# Patient Record
Sex: Male | Born: 1965 | Race: Black or African American | Hispanic: No | Marital: Married | Smoking: Current some day smoker
Health system: Southern US, Community
[De-identification: ages and names within clinical notes are randomized; demographics above are authoritative.]

## PROBLEM LIST (undated history)

## (undated) DIAGNOSIS — G4700 Insomnia, unspecified: Secondary | ICD-10-CM

## (undated) DIAGNOSIS — N529 Male erectile dysfunction, unspecified: Secondary | ICD-10-CM

## (undated) DIAGNOSIS — E785 Hyperlipidemia, unspecified: Secondary | ICD-10-CM

## (undated) DIAGNOSIS — I10 Essential (primary) hypertension: Secondary | ICD-10-CM

## (undated) DIAGNOSIS — G4730 Sleep apnea, unspecified: Secondary | ICD-10-CM

## (undated) DIAGNOSIS — F4001 Agoraphobia with panic disorder: Secondary | ICD-10-CM

## (undated) DIAGNOSIS — F4310 Post-traumatic stress disorder, unspecified: Secondary | ICD-10-CM

## (undated) DIAGNOSIS — D509 Iron deficiency anemia, unspecified: Secondary | ICD-10-CM

## (undated) DIAGNOSIS — J309 Allergic rhinitis, unspecified: Secondary | ICD-10-CM

## (undated) DIAGNOSIS — M1990 Unspecified osteoarthritis, unspecified site: Secondary | ICD-10-CM

## (undated) DIAGNOSIS — K219 Gastro-esophageal reflux disease without esophagitis: Secondary | ICD-10-CM

## (undated) HISTORY — DX: Hyperlipidemia, unspecified: E78.5

## (undated) HISTORY — PX: UPPER GASTROINTESTINAL ENDOSCOPY: SHX188

## (undated) HISTORY — DX: Insomnia, unspecified: G47.00

## (undated) HISTORY — DX: Post-traumatic stress disorder, unspecified: F43.10

## (undated) HISTORY — DX: Male erectile dysfunction, unspecified: N52.9

## (undated) HISTORY — DX: Allergic rhinitis, unspecified: J30.9

## (undated) HISTORY — PX: FOOT SURGERY: SHX648

## (undated) HISTORY — DX: Gastro-esophageal reflux disease without esophagitis: K21.9

## (undated) HISTORY — DX: Agoraphobia with panic disorder: F40.01

## (undated) HISTORY — DX: Iron deficiency anemia, unspecified: D50.9

## (undated) HISTORY — PX: COLONOSCOPY: SHX174

## (undated) HISTORY — DX: Essential (primary) hypertension: I10

## (undated) HISTORY — PX: ANTERIOR CRUCIATE LIGAMENT REPAIR: SHX115

## (undated) HISTORY — DX: Sleep apnea, unspecified: G47.30

---

## 1999-12-03 ENCOUNTER — Ambulatory Visit (HOSPITAL_BASED_OUTPATIENT_CLINIC_OR_DEPARTMENT_OTHER): Admission: RE | Admit: 1999-12-03 | Discharge: 1999-12-03 | Payer: Self-pay | Admitting: Orthopedic Surgery

## 2002-03-21 HISTORY — PX: MEDIAL COLLATERAL LIGAMENT AND LATERAL COLLATERAL LIGAMENT REPAIR, KNEE: SHX2017

## 2004-10-15 ENCOUNTER — Encounter: Admission: RE | Admit: 2004-10-15 | Discharge: 2004-10-15 | Payer: Self-pay | Admitting: Occupational Medicine

## 2006-11-03 ENCOUNTER — Encounter: Admission: RE | Admit: 2006-11-03 | Discharge: 2006-11-03 | Payer: Self-pay | Admitting: Occupational Medicine

## 2008-09-16 ENCOUNTER — Emergency Department (HOSPITAL_COMMUNITY): Admission: EM | Admit: 2008-09-16 | Discharge: 2008-09-16 | Payer: Self-pay | Admitting: Emergency Medicine

## 2008-11-17 ENCOUNTER — Encounter: Admission: RE | Admit: 2008-11-17 | Discharge: 2008-11-17 | Payer: Self-pay | Admitting: Internal Medicine

## 2010-08-06 NOTE — Op Note (Signed)
[place]  Patient:    Sean Haynes, Sean Haynes                       MRN: [id number] Proc. Date: 12/03/99 Adm. Date:  [id number] Disc. Date: [id number] Attending:  Milly Jakob CC:         Harvie Junior, M.D.   Operative Report  PREOPERATIVE DIAGNOSIS: 1. Anterior cruciate ligament tear. 2. Medial meniscus tear.  POSTOPERATIVE DIAGNOSIS: 1. Anterior cruciate ligament tear. 2. Medial meniscus tear.  OPERATION PERFORMED: 1. Partial posterior horn medial meniscectomy. 2. Anterior cruciate ligament reconstruction with central one third patellar    tendon.  SURGEON:  Harvie Junior, M.D.  ASSISTANT:  Currie Paris. Thedore Mins.  ANESTHESIA:  General.  INDICATIONS FOR PROCEDURE:  He is a 45 year old male with a long history of having had a twisting type injury while playing basketball.  The knee gave out on him and it was feeling a little bit unstable.  He had a big effusion right away and then it went down a little bit.  It was still having pain ____________ instability.  Came in to be evaluated.  It was felt like he had obvious anterior instability.  There was some question in my mind about his meniscus and an MRI was obtained and showed that he had an anterior cruciate ligament tear as well as a posterior horn medial meniscus tear.  After a thorough discussion of operative risks versus benefits, the patient ultimately elected to undergo an anterior cruciate ligament reconstruction.  He was brought to the operating room for this procedure.  DESCRIPTION OF PROCEDURE:  The patient was taken to the operating room and after adequate anesthesia was obtained with general anesthetic, the patient was placed supine on the operating table.  The right leg was then examined under anesthesia.  It had an obvious pivot shift.  It had no significant medial lateral instability.  At that point the leg was prepped and draped in the usual sterile fashion and  following this, routine arthroscopic examination of the knee revealed there was obvious posterior horn medial meniscus tear. The undersurface was probed and then this was felt to be in the white-white zone and also had a complex portion.  The posterior horn of the medial meniscus was then debrided with a combination of straight biting forceps, upbiting forceps and the remaining meniscal rim was contoured down with a suction shaver.  Multiple episodes of pivot shift were encountered while doing work on the posterior horn of the medial meniscus.  At this point attention was turned to the notch after the remaining meniscal rim was contoured down with a suction shaver.  Attention was turned to the notch and there were some decent fibers of the anterior cruciate ligament ____________ but they were not functioning with a probe.  You could just actually pull them apart and there was no insertion to the fibers.  At that point, the attention was turned laterally where there was noted to be a normal lateral compartment.  The anterior cruciate was then debrided the remaining fibers and a notchplasty was performed and following this the knee was exsanguinated and a blood pressure tourniquet was inflated to 300 mmHg.  Following this, an incision was made from the inferior pole of the patella distally.  The patellar tendon was identified both medially and laterally.  It was 41 mm in width.  The central 10 mm was resected with 10 mm bone plugs  proximally and distally.  Following this, attention was turned back to the knee where a 60 mm guide was used on a tibial guide and a tibial pilot hole was drilled.  The over-the-top guide was then used and the 10 mm hole was drilled on the femoral side.  A 5 mm footprint was made first to ensure there was adequate back wall and there was. At that point, the notch was made for the femoral screw.  The knee was then copiously irrigated and then suctioned dry.  A probe  was used to remove all bony fragments both medially and laterally.  Attention was then turned towards the femoral side and the Beeth needle which had previously been passed was pulled through the knee and the graft was advanced through the knee.  The femoral side was then locked in with a 7 x 25 mm screw, 5 mm recessed.  The knee was then cycled 10 times with tension on the graft. There was no tendency towards motion of the graft.  It was isometrically placed. At this point the leg was held in 10 degrees of flexion with a slight posterior drawer and the tibial screw was then advanced into place.  At this point the knee was then examined.  Noted the pivot shift was gone.  The anterior drawer was gone.  The graft was probed and felt to be excellently tightened and the screw was then advanced until the graft was locked in place.  At this point, a final check was made of the graft went through full range of motion easily in to full extension and the sutures were removed and the wire was removed distally.  The patellar tendon defect was then closed with four interrupted #2 Ethibond sutures and then the paratenon was closed with a 0 Vicryl running suture.  The subcutaneous was then closed with 0 and 2-0 Vicryl and the skin with 3-0 Monocryl suture.  Steri-Strips were placed over the portals and a sterile compressive dressing was then applied as well as a TED stocking.  Then the cryocuff and knee immobilizer were applied and the patient was taken to the recovery room where he was noted to be in satisfactory condition.  Estimated blood loss was about none. DD:  12/03/99 TD:  12/05/99 Job: [id number]

## 2010-12-28 ENCOUNTER — Encounter: Payer: Self-pay | Admitting: Cardiology

## 2010-12-28 ENCOUNTER — Ambulatory Visit (INDEPENDENT_AMBULATORY_CARE_PROVIDER_SITE_OTHER): Payer: BC Managed Care – PPO | Admitting: Cardiology

## 2010-12-28 DIAGNOSIS — R9431 Abnormal electrocardiogram [ECG] [EKG]: Secondary | ICD-10-CM

## 2010-12-28 DIAGNOSIS — I10 Essential (primary) hypertension: Secondary | ICD-10-CM | POA: Insufficient documentation

## 2010-12-28 DIAGNOSIS — F17200 Nicotine dependence, unspecified, uncomplicated: Secondary | ICD-10-CM | POA: Insufficient documentation

## 2010-12-28 DIAGNOSIS — Z720 Tobacco use: Secondary | ICD-10-CM

## 2010-12-28 MED ORDER — CLONIDINE HCL 0.1 MG PO TABS
0.1000 mg | ORAL_TABLET | Freq: Once | ORAL | Status: AC
  Administered 2010-12-28: 0.1 mg via ORAL

## 2010-12-28 MED ORDER — LABETALOL HCL 100 MG PO TABS: 100.0000 mg | ORAL_TABLET | Freq: Two times a day (BID) | ORAL | Status: DC

## 2010-12-28 NOTE — Patient Instructions (Addendum)
Your physician recommends that you schedule a follow-up appointment in: 6 weeks  Your physician has requested that you have an echocardiogram. Echocardiography is a painless test that uses sound waves to create images of your heart. It provides your doctor with information about the size and shape of your heart and how well your heart's chambers and valves are working. This procedure takes approximately one hour. There are no restrictions for this procedure.   Your physician has recommended you make the following change in your medication:Start labetalol 100 mg by mouth twice daily.

## 2010-12-28 NOTE — Progress Notes (Signed)
HPI: 45 year old male with no prior cardiac history for evaluation of abnormal electrocardiogram and hypertension. Patient recently noted to have elevated blood pressure on screening. An electrocardiogram was performed and showed sinus rhythm with left ventricular hypertrophy and peaked T waves. Because of the above we were asked to further evaluate. Note his potassium was checked and was normal at 4.4. TSH normal. LFTs were mildly elevated and fu labs scheduled. The patient denies dyspnea, chest pain, palpitations or syncope. No headaches.  Current Outpatient Prescriptions  Medication Sig Dispense Refill  . lisinopril-hydrochlorothiazide (PRINZIDE,ZESTORETIC) 20-12.5 MG per tablet Take 1 tablet by mouth daily.          No Known Allergies  Past Medical History  Diagnosis Date  . Hypertension   . Hyperlipidemia     Past Surgical History  Procedure Date  . Medial collateral ligament and lateral collateral ligament repair, knee 2004    right    History   Social History  . Marital Status: Single    Spouse Name: N/A    Number of Children: 2  . Years of Education: N/A   Occupational History  .  [employer]   Social History Main Topics  . Smoking status: Current Everyday Smoker -- 2.0 packs/day for 34 years    Types: Cigarettes  . Smokeless tobacco: Never Used  . Alcohol Use: Yes     6 pack of beer and half a fifth per week  . Drug Use: No  . Sexually Active: Not on file   Other Topics Concern  . Not on file   Social History Narrative  . No narrative on file    Family History  Problem Relation Age of Onset  . Anemia Mother   . Arrhythmia Mother   . Arrhythmia Sister   . Arrhythmia Brother   . Diabetes Mother   . Diabetes Father   . Hyperlipidemia Mother   . Hyperlipidemia Father   . Hypertension Mother   . Hypertension Father   . Hypertension Sister   . Hypertension Sister   . Hypertension Brother   . Hypertension Brother   . Stroke Father     ROS:  no fevers or chills, productive cough, hemoptysis, dysphasia, odynophagia, melena, hematochezia, dysuria, hematuria, rash, seizure activity, orthopnea, PND, pedal edema, claudication. Remaining systems are negative.  Physical Exam: Blood pressure left arm 198/110. Blood pressure right arm 204/112. General:  Well developed/well nourished in NAD Skin warm/dry Patient not depressed No peripheral clubbing Back-normal HEENT-normal/normal eyelids; no papilledema.  Neck supple/normal carotid upstroke bilaterally; no bruits; no JVD; no thyromegaly chest - CTA/ normal expansion CV - RRR/normal S1 and S2; no murmurs, rubs or gallops;  PMI nondisplaced Abdomen -NT/ND, no HSM, no mass, + bowel sounds, no bruit 2+ femoral pulses, no bruits Ext-no edema, chords, 2+ DP Neuro-grossly nonfocal  ECG 12/27/10 NSR LVH peaked Tw[place]

## 2010-12-28 NOTE — Assessment & Plan Note (Signed)
Patient's electrocardiogram shows left ventricular hypertrophy and peak T waves. However potassium is normal. No symptoms. Await echocardiogram.

## 2010-12-28 NOTE — Assessment & Plan Note (Addendum)
Patient's blood pressure significantly elevated today. There is no papilledema and no symptoms of headache, shortness of breath or chest pain. Patient given 0.1 mg of clonidine in the office p.o. Blood pressure improved to 178/106. Continue lisinopril HCT. Add labetalol 100 mg b.i.d and increase as needed. I have instructed patient to purchase a blood pressure monitor and keep records at home. We will increase medications as needed. Note recent TSH normal. Check echocardiogram for LV function and left ventricular hypertrophy. Patient instructed on lifestyle modification including exercise, low sodium diet, discontinuing tobacco use and reducing alcohol use. I think he most likely has primary hypertension as he was diagnosed many years ago and has a strong family history. If his blood pressure becomes difficult to control we will consider workup for secondary hypertension.

## 2010-12-28 NOTE — Assessment & Plan Note (Signed)
Patient counseled on discontinuing. 

## 2011-01-06 ENCOUNTER — Encounter: Payer: Self-pay | Admitting: *Deleted

## 2011-01-11 ENCOUNTER — Other Ambulatory Visit (HOSPITAL_COMMUNITY): Payer: BC Managed Care – PPO | Admitting: Radiology

## 2011-01-11 ENCOUNTER — Ambulatory Visit (HOSPITAL_COMMUNITY): Payer: BC Managed Care – PPO | Attending: Cardiology | Admitting: Radiology

## 2011-01-11 DIAGNOSIS — F17200 Nicotine dependence, unspecified, uncomplicated: Secondary | ICD-10-CM | POA: Insufficient documentation

## 2011-01-11 DIAGNOSIS — I10 Essential (primary) hypertension: Secondary | ICD-10-CM

## 2011-01-11 DIAGNOSIS — R9431 Abnormal electrocardiogram [ECG] [EKG]: Secondary | ICD-10-CM

## 2011-01-19 ENCOUNTER — Telehealth: Payer: Self-pay | Admitting: Cardiology

## 2011-01-19 DIAGNOSIS — I10 Essential (primary) hypertension: Secondary | ICD-10-CM

## 2011-01-19 MED ORDER — LABETALOL HCL 100 MG PO TABS: 100.0000 mg | ORAL_TABLET | Freq: Two times a day (BID) | ORAL | Status: DC

## 2011-01-19 MED ORDER — LISINOPRIL-HYDROCHLOROTHIAZIDE 20-12.5 MG PO TABS: 1.0000 | ORAL_TABLET | Freq: Every day | ORAL | Status: DC

## 2011-01-19 NOTE — Telephone Encounter (Signed)
Spoke with pt, his bp is running good on his current regimen. He is needing refills, sent to pharm [facility]

## 2011-01-19 NOTE — Telephone Encounter (Signed)
New message  Pt calling about meds lisinipril and labetalol  He wants to know if he should continue taking both meds?  Please call

## 2011-02-02 ENCOUNTER — Encounter: Payer: Self-pay | Admitting: Cardiology

## 2011-02-18 ENCOUNTER — Encounter: Payer: Self-pay | Admitting: Cardiology

## 2011-02-18 ENCOUNTER — Ambulatory Visit (INDEPENDENT_AMBULATORY_CARE_PROVIDER_SITE_OTHER): Payer: BC Managed Care – PPO | Admitting: Cardiology

## 2011-02-18 VITALS — BP 120/70 | HR 87 | Ht 76.0 in | Wt 251.0 lb

## 2011-02-18 DIAGNOSIS — I10 Essential (primary) hypertension: Secondary | ICD-10-CM

## 2011-02-18 MED ORDER — LISINOPRIL 40 MG PO TABS: 40.0000 mg | ORAL_TABLET | Freq: Every day | ORAL | Status: DC

## 2011-02-18 MED ORDER — HYDROCHLOROTHIAZIDE 12.5 MG PO CAPS: 12.5000 mg | ORAL_CAPSULE | Freq: Every day | ORAL | Status: DC

## 2011-02-18 NOTE — Assessment & Plan Note (Signed)
Patient counseled on discontinuing. 

## 2011-02-18 NOTE — Assessment & Plan Note (Signed)
Patient's blood pressure has improved but he states it typically runs 140/85-90 at home on present medications. Increase lisinopril to 40 mg daily. Check potassium and renal function in one week. He will continue lifestyle modification. Followup in 6 months and he will track his blood pressure at home. If it is controlled we will ask him to followup with his primary care physician at that time.

## 2011-02-18 NOTE — Progress Notes (Signed)
HPI:45 year old male I initially saw in Oct 2012 for evaluation of abnormal electrocardiogram and hypertension. Note his potassium was checked and was normal at 4.4. TSH normal. LFTs were mildly elevated and fu labs scheduled. Echo in Oct 2012 showed normal LV function, mild LVH and moderate LAE. Since I last saw him, the patient denies any dyspnea on exertion, orthopnea, PND, pedal edema, palpitations, syncope or chest pain.    Current Outpatient Prescriptions  Medication Sig Dispense Refill  . labetalol (NORMODYNE) 100 MG tablet Take 1 tablet (100 mg total) by mouth 2 (two) times daily.  60 tablet  12  . lisinopril-hydrochlorothiazide (PRINZIDE,ZESTORETIC) 20-12.5 MG per tablet Take 1 tablet by mouth daily.  30 tablet  12     Past Medical History  Diagnosis Date  . Hypertension   . Hyperlipidemia     Past Surgical History  Procedure Date  . Medial collateral ligament and lateral collateral ligament repair, knee 2004    right    History   Social History  . Marital Status: Single    Spouse Name: N/A    Number of Children: 2  . Years of Education: N/A   Occupational History  .  [employer]   Social History Main Topics  . Smoking status: Current Everyday Smoker -- 2.0 packs/day for 34 years    Types: Cigarettes  . Smokeless tobacco: Never Used  . Alcohol Use: Yes     6 pack of beer and half a fifth per week  . Drug Use: No  . Sexually Active: Not on file   Other Topics Concern  . Not on file   Social History Narrative  . No narrative on file    ROS: no fevers or chills, productive cough, hemoptysis, dysphasia, odynophagia, melena, hematochezia, dysuria, hematuria, rash, seizure activity, orthopnea, PND, pedal edema, claudication. Remaining systems are negative.  Physical Exam: Well-developed well-nourished in no acute distress.  Skin is warm and dry.  HEENT is normal.  Neck is supple. No thyromegaly.  Chest is clear to auscultation with normal  expansion.  Cardiovascular exam is regular rate and rhythm.  Abdominal exam nontender or distended. No masses palpated. Extremities show no edema. neuro grossly intact

## 2011-02-18 NOTE — Patient Instructions (Signed)
Your physician wants you to follow-up in: 6 MONTHS You will receive a reminder letter in the mail two months in advance. If you don't receive a letter, please call our office to schedule the follow-up appointment.   STOP LISINOPRIL/HCTZ  START LISINOPRIL 40 MG ONCE DAILY  START HCTZ 12.5 MG ONCE DAILY  Your physician recommends that you return for lab work in: ONE WEEK

## 2011-02-25 ENCOUNTER — Other Ambulatory Visit (INDEPENDENT_AMBULATORY_CARE_PROVIDER_SITE_OTHER): Payer: BC Managed Care – PPO | Admitting: *Deleted

## 2011-02-25 DIAGNOSIS — I10 Essential (primary) hypertension: Secondary | ICD-10-CM

## 2011-02-25 LAB — BASIC METABOLIC PANEL
BUN: 17 mg/dL (ref 6–23)
CO2: 26 mEq/L (ref 19–32)
Calcium: 9 mg/dL (ref 8.4–10.5)
Chloride: 106 mEq/L (ref 96–112)
Creatinine, Ser: 1.4 mg/dL (ref 0.4–1.5)
GFR: 72.75 mL/min (ref >60.00)
Glucose, Bld: 83 mg/dL (ref 70–99)
Sodium: 141 mEq/L (ref 135–145)

## 2011-10-27 ENCOUNTER — Encounter: Payer: BC Managed Care – PPO | Admitting: Cardiology

## 2011-10-27 NOTE — Progress Notes (Signed)
   HPI: 46 year old male I initially saw in Oct 2012 for evaluation of abnormal electrocardiogram and hypertension. Note his potassium was checked and was normal at 4.4. TSH normal. LFTs were mildly elevated and fu labs scheduled. Echo in Oct 2012 showed normal LV function, mild LVH and moderate LAE. Since I last saw him, the patient denies any dyspnea on exertion, orthopnea, PND, pedal edema, palpitations, syncope or chest pain.   Current Outpatient Prescriptions  Medication Sig Dispense Refill  . hydrochlorothiazide (MICROZIDE) 12.5 MG capsule Take 1 capsule (12.5 mg total) by mouth daily.  30 capsule  12  . labetalol (NORMODYNE) 100 MG tablet Take 1 tablet (100 mg total) by mouth 2 (two) times daily.  60 tablet  12  . lisinopril (PRINIVIL,ZESTRIL) 40 MG tablet Take 1 tablet (40 mg total) by mouth daily.  30 tablet  12     Past Medical History  Diagnosis Date  . Hypertension   . Hyperlipidemia     Past Surgical History  Procedure Date  . Medial collateral ligament and lateral collateral ligament repair, knee 2004    right    History   Social History  . Marital Status: Single    Spouse Name: N/A    Number of Children: 2  . Years of Education: N/A   Occupational History  .  [employer]   Social History Main Topics  . Smoking status: Current Everyday Smoker -- 2.0 packs/day for 34 years    Types: Cigarettes  . Smokeless tobacco: Never Used  . Alcohol Use: Yes     6 pack of beer and half a fifth per week  . Drug Use: No  . Sexually Active: Not on file   Other Topics Concern  . Not on file   Social History Narrative  . No narrative on file    ROS: no fevers or chills, productive cough, hemoptysis, dysphasia, odynophagia, melena, hematochezia, dysuria, hematuria, rash, seizure activity, orthopnea, PND, pedal edema, claudication. Remaining systems are negative.  Physical Exam: Well-developed well-nourished in no acute distress.  Skin is warm and dry.  HEENT  is normal.  Neck is supple. No thyromegaly.  Chest is clear to auscultation with normal expansion.  Cardiovascular exam is regular rate and rhythm.  Abdominal exam nontender or distended. No masses palpated. Extremities show no edema. neuro grossly intact  ECG     This encounter was created in error - please disregard.

## 2012-10-23 ENCOUNTER — Other Ambulatory Visit: Payer: Self-pay | Admitting: Family Medicine

## 2012-10-23 ENCOUNTER — Ambulatory Visit
Admission: RE | Admit: 2012-10-23 | Discharge: 2012-10-23 | Disposition: A | Discharge status: Home / Self Care | Payer: No Typology Code available for payment source | Source: Ambulatory Visit | Attending: Family Medicine | Admitting: Family Medicine

## 2012-10-23 DIAGNOSIS — Z0000 Encounter for general adult medical examination without abnormal findings: Secondary | ICD-10-CM

## 2012-11-15 ENCOUNTER — Ambulatory Visit (INDEPENDENT_AMBULATORY_CARE_PROVIDER_SITE_OTHER): Payer: BC Managed Care – PPO | Admitting: Physician Assistant

## 2012-11-15 VITALS — BP 150/90 | HR 102 | Temp 99.0°F | Resp 16 | Ht 75.25 in | Wt 271.0 lb

## 2012-11-15 DIAGNOSIS — I10 Essential (primary) hypertension: Secondary | ICD-10-CM

## 2012-11-15 MED ORDER — LABETALOL HCL 100 MG PO TABS: 100.0000 mg | ORAL_TABLET | Freq: Two times a day (BID) | ORAL | Status: DC

## 2012-11-15 MED ORDER — HYDROCHLOROTHIAZIDE 12.5 MG PO CAPS: 25.0000 mg | ORAL_CAPSULE | Freq: Every day | ORAL | Status: DC

## 2012-11-15 MED ORDER — LISINOPRIL 40 MG PO TABS: 40.0000 mg | ORAL_TABLET | Freq: Every day | ORAL | Status: DC

## 2012-11-15 MED ORDER — HYDROCHLOROTHIAZIDE 25 MG PO TABS: 25.0000 mg | ORAL_TABLET | Freq: Every day | ORAL | Status: DC

## 2012-11-15 NOTE — Progress Notes (Signed)
Patient ID: Sean Haynes MRN: [id number], DOB: December 20, 1965, 47 y.o. Date of Encounter: 11/15/2012, 11:02 AM  Primary Physician: No primary provider on file.  Chief Complaint: HTN  HPI: 47 y.o. male with history below presents for hypertension follow up. Has been out of his medications for 2 days. Previously followed by outside urgent care. Trying to get reestablished with the [facility] healthcare system, waiting on paperwork. Has been taking lisinopril 40 mg daily, HCTZ 12.5 mg daily, and labetalol 100 mg bid without issues. Blood pressure at his recent work CPE 1.5 weeks ago was 120's/80's. Has not checked his BP at home recently. Rarely drinks ETOH, maybe a glass once a month. Working on quitting smoking. Starting to eat healthy. No chest pain, chest tightness, headaches, vision changes, palpitations, edema, orthopnea, or focal deficits. He had labs drawn at his work CPE and they were all normal he reports except his cholesterol was a little high. He was advised to work on diet and exercise and follow up with fasting lipids in 6 moths.     Past Medical History  Diagnosis Date  . Hypertension   . Hyperlipidemia      Home Meds: Prior to Admission medications   Medication Sig Start Date End Date Taking? Authorizing Provider  hydrochlorothiazide (HYDRODIURIL) 12.5 MG tablet Take 1 tablet (25 mg total) by mouth daily. 11/15/12  no Raymon Mutton Dunn, PA-C  labetalol (NORMODYNE) 100 MG tablet Take 1 tablet (100 mg total) by mouth 2 (two) times daily. 11/15/12 11/15/13 no Raymon Mutton Dunn, PA-C  lisinopril (PRINIVIL,ZESTRIL) 40 MG tablet Take 1 tablet (40 mg total) by mouth daily. 11/15/12 11/15/13 no Sondra Barges, PA-C    Allergies: No Known Allergies  History   Social History  . Marital Status: Single    Spouse Name: N/A    Number of Children: 2  . Years of Education: N/A   Occupational History  .  [employer]   Social History Main Topics  . Smoking status: Current Every Day Smoker -- 2.00  packs/day for 34 years    Types: Cigarettes  . Smokeless tobacco: Never Used  . Alcohol Use: Yes     Comment: 6 pack of beer and half a fifth per week  . Drug Use: No  . Sexual Activity: Not on file   Other Topics Concern  . Not on file   Social History Narrative  . No narrative on file     Family History  Problem Relation Age of Onset  . Anemia Mother   . Arrhythmia Mother   . Arrhythmia Sister   . Arrhythmia Brother   . Diabetes Mother   . Diabetes Father   . Hyperlipidemia Mother   . Hyperlipidemia Father   . Hypertension Mother   . Hypertension Father   . Hypertension Sister   . Hypertension Sister   . Hypertension Brother   . Hypertension Brother   . Stroke Father     Review of Systems: Constitutional: negative for chills, fever, night sweats, weight changes, or fatigue  HEENT: negative for vision changes or hearing loss Cardiovascular: negative for chest pain, chest tightness, edema, orthopnea, palpitations, or DOE Respiratory: negative for wheezing, shortness of breath, or cough Abdominal: negative for abdominal pain, nausea, vomiting, diarrhea, or constipation Dermatological: negative for rash Neurologic: negative for headache, dizziness, or syncope   Physical Exam: Blood pressure 150/90, pulse 102, temperature 99 F (37.2 C), temperature source Oral, resp. rate 16, height 6' 3.25" (1.911 m), weight  271 lb (122.925 kg), SpO2 99.00%., Body mass index is 33.66 kg/(m^2). General: Well developed, well nourished, in no acute distress. Head: Normocephalic, atraumatic, eyes without discharge, sclera non-icteric, nares are without discharge. Bilateral auditory canals clear, TM's are without perforation, pearly grey and translucent with reflective cone of light bilaterally. Oral cavity moist, posterior pharynx without exudate, erythema, peritonsillar abscess, or post nasal drip.  Neck: Supple. No thyromegaly. Full ROM. No lymphadenopathy. No carotid bruits. Lungs:  Clear bilaterally to auscultation without wheezes, rales, or rhonchi. Breathing is unlabored. Heart: RRR with S1 S2. No murmurs, rubs, or gallops appreciated.  Msk:  Strength and tone normal for age. Extremities/Skin: Warm and dry. No clubbing or cyanosis. No edema. No rashes or suspicious lesions. Distal pulses 2+ and equal bilaterally. Neuro: Alert and oriented X 3. Moves all extremities spontaneously. Gait is normal. CNII-XII grossly in tact. DTR 2+, cerebellar function intact. Rhomberg normal. Psych:  Responds to questions appropriately with a normal affect.   Labs:  Labs normal at recent employer CPE  ASSESSMENT AND PLAN:  47 y.o. male with hypertension -Lisinopril 40 mg 1 po daily #30 RF 3 -HCTZ 25 mg 1 po daily #30 RF 3 -Labetalol 100 mg 1 po bid #60 RF 3 -Stop smoking -Healthy diet -Exercise -Weight loss -Continue no alcohol  -Either follow up with [facility] or [place] as PCP  Signed, Eula Listen, PA-C 11/15/2012 11:02 AM

## 2014-07-15 ENCOUNTER — Other Ambulatory Visit: Payer: Self-pay | Admitting: Family Medicine

## 2014-07-15 DIAGNOSIS — M25562 Pain in left knee: Secondary | ICD-10-CM

## 2014-07-26 ENCOUNTER — Ambulatory Visit
Admission: RE | Admit: 2014-07-26 | Discharge: 2014-07-26 | Disposition: A | Discharge status: Home / Self Care | Payer: BLUE CROSS/BLUE SHIELD | Source: Ambulatory Visit | Attending: Family Medicine | Admitting: Family Medicine

## 2014-07-26 DIAGNOSIS — M25562 Pain in left knee: Secondary | ICD-10-CM

## 2016-07-04 IMAGING — MR MR KNEE*L* W/O CM
4 of 6 series · 18 of 40 positions shown · non-contrast
Comparison: None.

CLINICAL DATA: Left knee pain with walking, over the past year.

EXAM:
MRI OF THE LEFT KNEE WITHOUT CONTRAST
TECHNIQUE: Multiplanar, multisequence MR imaging of the knee was performed. No
intravenous contrast was administered.

[Series 3: PD fat-sat · axial · 3.5mm · 0.31mm/px · z∈[-86,+45]mm · 8 of 31 slices shown (1 of 4)]
[im 1/31]
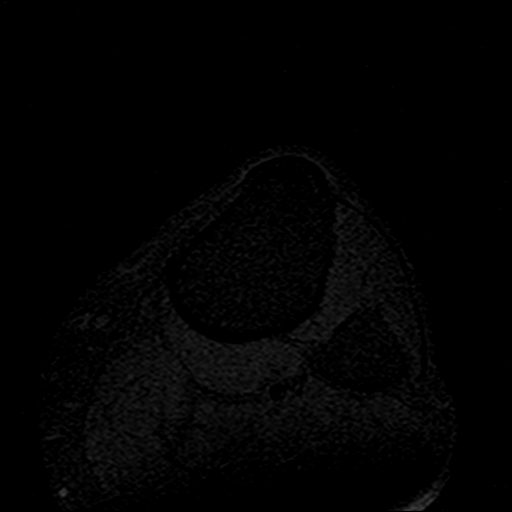
[im 5/31]
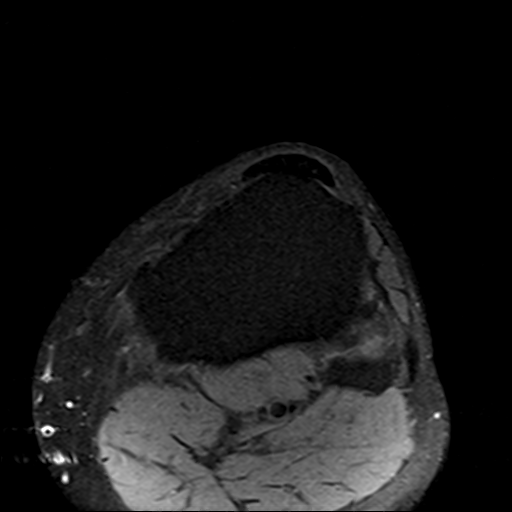
[im 9/31]
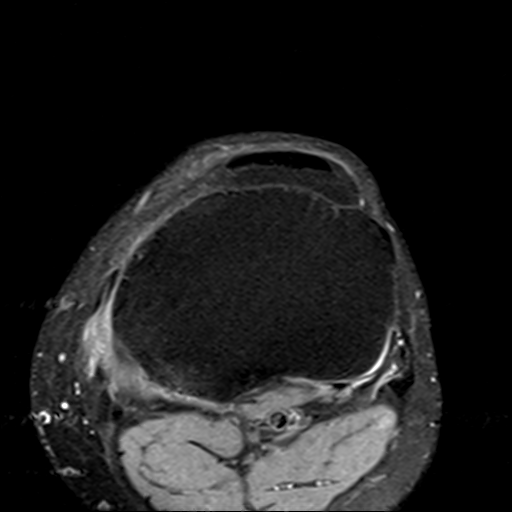
[im 13/31]
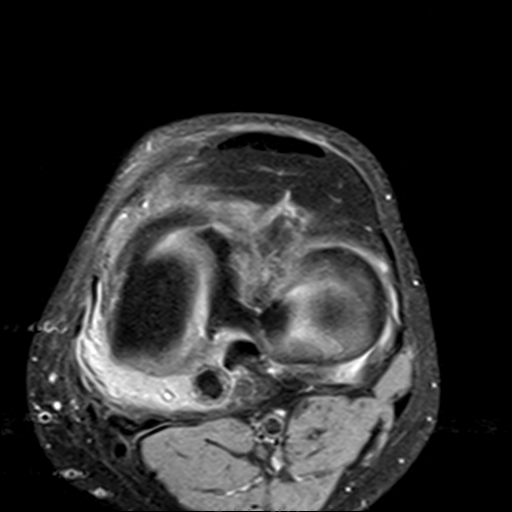
[im 18/31]
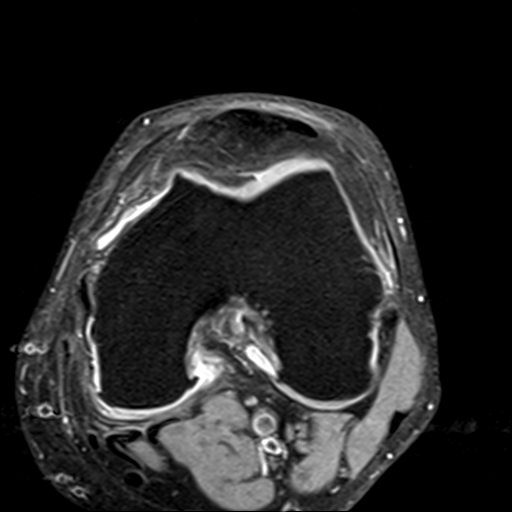
[im 22/31]
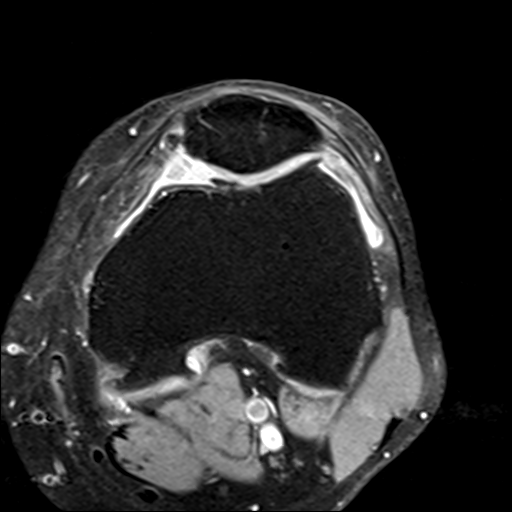
[im 26/31]
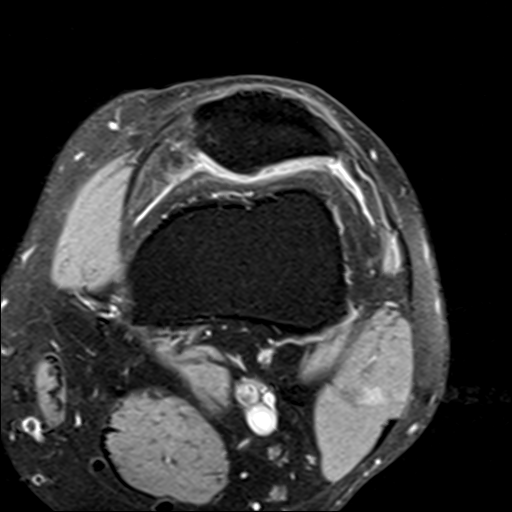
[im 31/31]
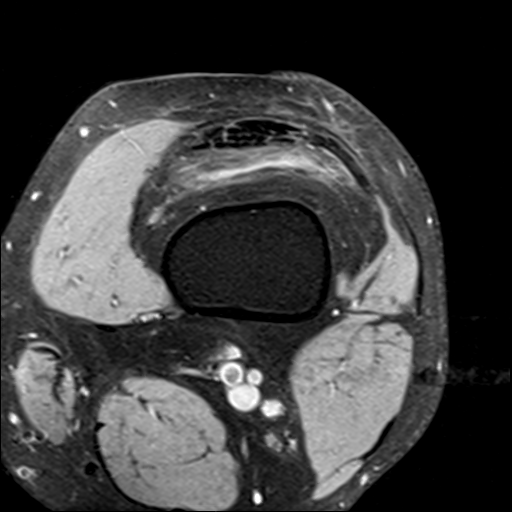

[Series 4: PD fat-sat · coronal · 3.5mm · 0.29mm/px · 4 of 24 slices shown (2 of 4)]
[im 1/24]
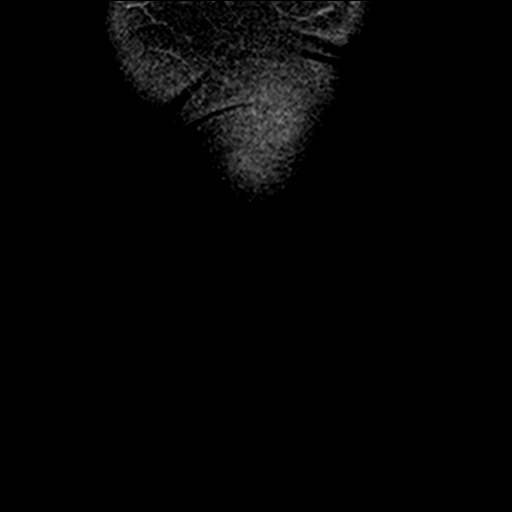
[im 4/24]
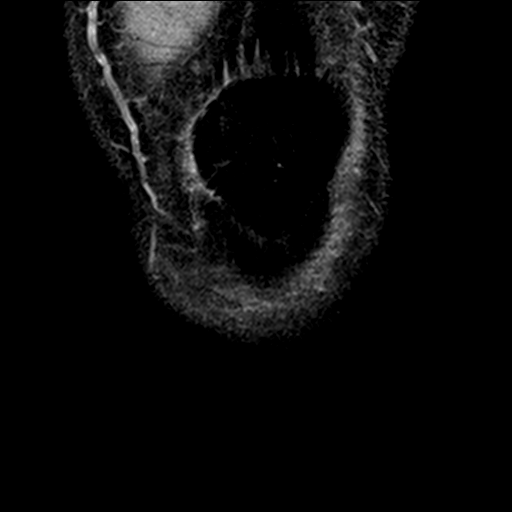
[im 12/24]
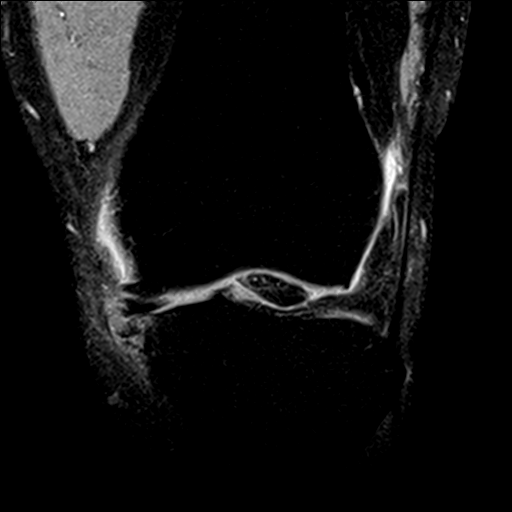
[im 20/24]
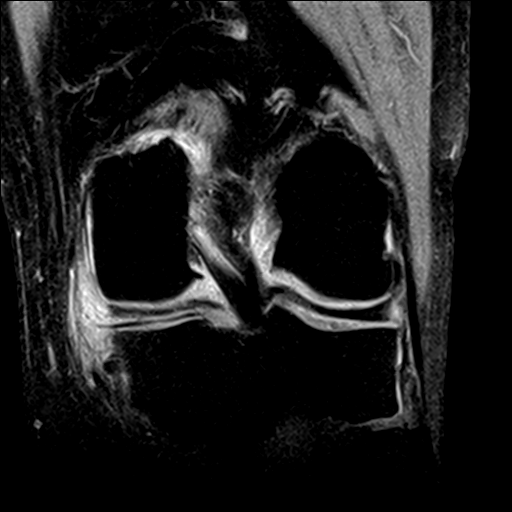

[Series 6: PD fat-sat · sagittal · 3.2mm · 0.29mm/px · 3 of 28 slices shown (3 of 4)]
[im 4/28]
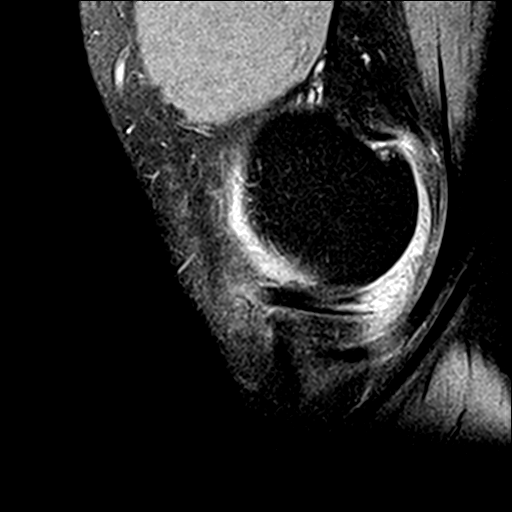
[im 16/28]
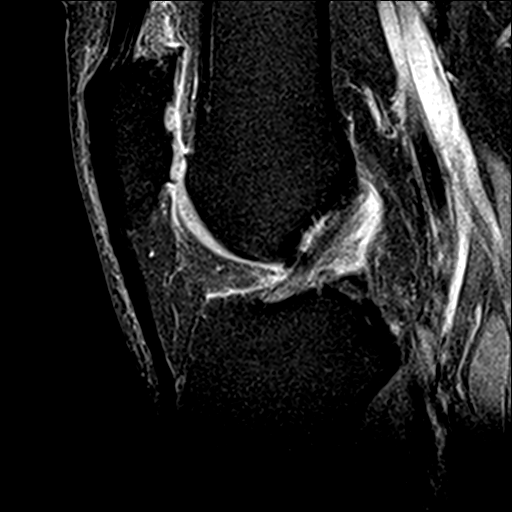
[im 24/28]
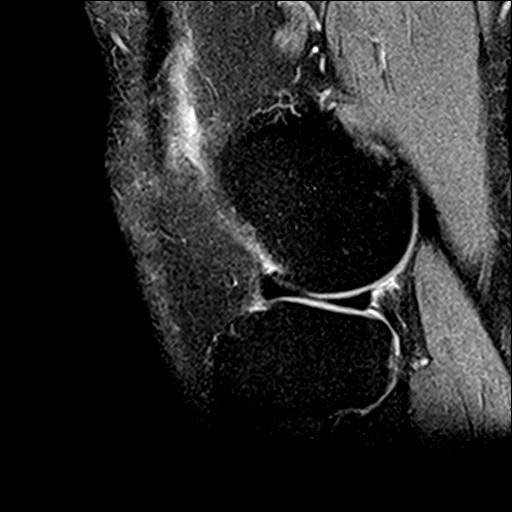

[Series 8: PD fat-sat · oblique · 2.0mm · 0.29mm/px · 3 of 11 slices shown (4 of 4)]
[im 1/11]
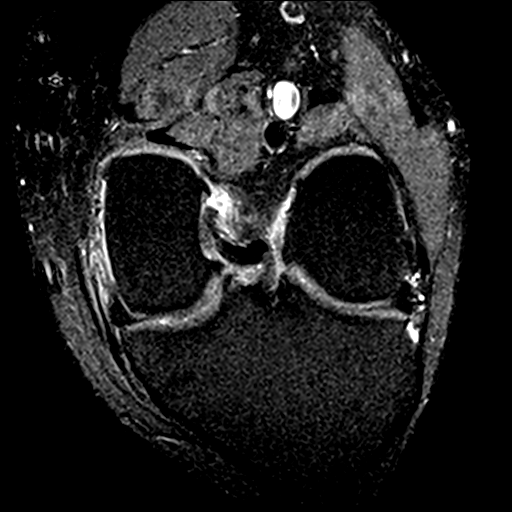
[im 6/11]
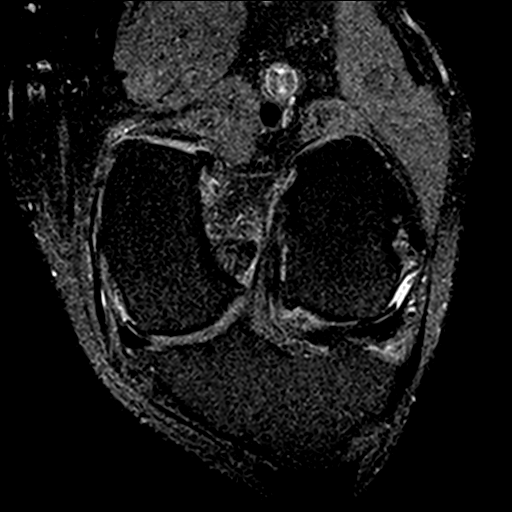
[im 11/11]
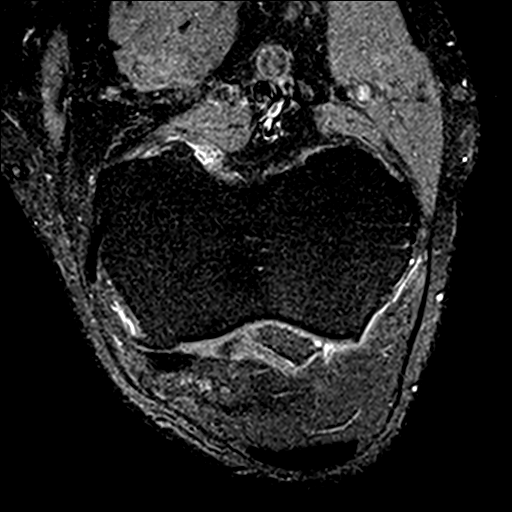

[18 of 40 positions shown; findings below may reference images not displayed]

FINDINGS: MENISCI

Medial meniscus: Grade 3 horizontal tear of the posterior horn
extends to the inferior meniscal surface in the meniscal periphery.
Amorphous increased signal along the periphery of the meniscus,
probably an amorphous complex parameniscal cyst. The abnormal signal
extends centrally in the meniscal tissue to the meniscal root.

Lateral meniscus:  Unremarkable

LIGAMENTS

Cruciates:  Unremarkable

Collaterals:  Trace MCL bursitis.

CARTILAGE

Patellofemoral: Moderate degenerative chondral thinning with areas
of full-thickness chondral fissuring and irregularity.

Medial: Moderate to severe degenerative chondral thinning with
associated chondral irregularity.

Lateral: Moderate degenerative chondral thinning with a
full-thickness 0.8 by 1.5 cm chondral defect posteriorly along the
lateral femoral condyle.

Joint:  Trace knee effusion.

Popliteal Fossa:  Unremarkable

Extensor Mechanism:  Unremarkable

Bones: Tricompartmental spurring. Large posterolateral spur from the
medial femoral condyle partially undermined by degenerative
subcortical cysts. 9 mm degenerative osteochondral lesion along the
inferomedial portion of the femoral trochlear groove.
IMPRESSION: 1. Grade 3 horizontal tear of the posterior horn medial meniscus
involving the inferior meniscal surface and meniscal periphery, with
amorphous peripheral increased signal along the meniscus likely a
complex parameniscal cyst.
2. Moderate to prominent degenerative chondral thinning with
associated chondral irregularities as noted above. Tricompartmental
spurring. Degenerative osteochondral lesion along the inferomedial
portion of the femoral trochlear groove.
3. Trace MCL bursitis and trace knee effusion.

## 2018-05-19 ENCOUNTER — Other Ambulatory Visit: Payer: Self-pay

## 2018-05-19 ENCOUNTER — Ambulatory Visit (HOSPITAL_COMMUNITY)
Admission: EM | Admit: 2018-05-19 | Discharge: 2018-05-19 | Disposition: A | Discharge status: Home / Self Care | Payer: BLUE CROSS/BLUE SHIELD | Attending: Family Medicine | Admitting: Family Medicine

## 2018-05-19 ENCOUNTER — Encounter (HOSPITAL_COMMUNITY): Payer: Self-pay

## 2018-05-19 DIAGNOSIS — M25562 Pain in left knee: Secondary | ICD-10-CM

## 2018-05-19 DIAGNOSIS — G8929 Other chronic pain: Secondary | ICD-10-CM

## 2018-05-19 MED ORDER — TRAMADOL HCL 50 MG PO TABS: 50.0000 mg | ORAL_TABLET | Freq: Four times a day (QID) | ORAL | 0 refills | Status: DC | PRN

## 2018-05-19 NOTE — ED Provider Notes (Signed)
Sean Haynes   [id number] 05/19/18 Arrival Time: 1216  ASSESSMENT & PLAN:  1. Chronic pain of left knee    No indication for knee imaging today. Discussed. [facility].  Meds ordered this encounter  Medications  . traMADol (ULTRAM) 50 MG tablet    Sig: Take 1 tablet (50 mg total) by mouth every 6 (six) hours as needed.    Dispense:  15 tablet    Refill:  0     Discharge Instructions     Start taking Meloxicam that you have at home.  Be aware, pain medications may cause drowsiness. Please do not drive, operate heavy machinery or make important decisions while on this medication, it can cloud your judgement.    Has a supportive knee brace at home that he will begin wearing daily. Work note provided.  May f/u here as needed.  [place] Controlled Substances Registry consulted for this patient. I feel the risk/benefit ratio today is favorable for proceeding with this prescription for a controlled substance. Medication sedation precautions given.  Reviewed expectations re: course of current medical issues. Questions answered. Outlined signs and symptoms indicating need for more acute intervention. Patient verbalized understanding. After Visit Summary given.  SUBJECTIVE: History from: patient. Sean Haynes is a 53 y.o. male who reports fairly persistent moderate to severe pain of his left medial knee; described as aching and with occasional sharp pain. Feels sensation that knee wants to give out on him over the past few weeks. Pain present on and off for several years. Sees an orthopaedist at the [place]. Has been told he will need a knee replacement. Steroid injections have helped a little in the past. "Doctor said no more injections." Injury/trama: no. Symptoms have gradually worsened since beginning. Aggravating factors: weight bearing and certain movements of knee. Alleviating factors: somewhat  better with rest. Associated symptoms: none reported. Extremity sensation changes or weakness: none. Self treatment: has not tried OTCs for relief of pain. History of similar: yes.  Past Surgical History:  Procedure Laterality Date  . MEDIAL COLLATERAL LIGAMENT AND LATERAL COLLATERAL LIGAMENT REPAIR, KNEE  2004   right     ROS: As per HPI. All other systems negative.    OBJECTIVE:  Vitals:   05/19/18 1235 05/19/18 1238  BP:  139/85  Pulse:  95  Resp:  18  Temp:  98.9 F (37.2 C)  TempSrc:  Oral  SpO2:  98%  Weight: 120.2 kg     General appearance: alert; no distress HEENT: Sean Haynes; AT Back: FROM at waist Extremities: . LLE: warm and well perfused; fairly well localized moderate tenderness over left medial knee over MCL; without gross deformities; with very slight and localized swelling around his MCV; with no bruising; ROM: normal; no frank instability on exam CV: brisk extremity capillary refill of LLE; 2+ DP and PT pulse of LLE. Skin: warm and dry; no visible rashes Neurologic: gait normal but favors left knee; normal reflexes of RLE and LLE; normal sensation of RLE and LLE; normal strength of RLE and LLE Psychological: alert and cooperative; normal mood and affect  No Known Allergies  Past Medical History:  Diagnosis Date  . Hyperlipidemia   . Hypertension    Social History   Socioeconomic History  . Marital status: Single    Spouse name: Not on file  . Number of children: 2  . Years of education: Not on file  . Highest education  level: Not on file  Occupational History    Employer: [employer]  Social Needs  . Financial resource strain: Not on file  . Food insecurity:    Worry: Not on file    Inability: Not on file  . Transportation needs:    Medical: Not on file    Non-medical: Not on file  Tobacco Use  . Smoking status: Current Every Day Smoker    Packs/day: 2.00    Years: 34.00    Pack years: 68.00    Types: Cigarettes  . Smokeless tobacco:  Never Used  Substance and Sexual Activity  . Alcohol use: Yes    Comment: 6 pack of beer and half a fifth per week  . Drug use: No  . Sexual activity: Not on file  Lifestyle  . Physical activity:    Days per week: Not on file    Minutes per session: Not on file  . Stress: Not on file  Relationships  . Social connections:    Talks on phone: Not on file    Gets together: Not on file    Attends religious service: Not on file    Active member of club or organization: Not on file    Attends meetings of clubs or organizations: Not on file    Relationship status: Not on file  Other Topics Concern  . Not on file  Social History Narrative  . Not on file   Family History  Problem Relation Age of Onset  . Diabetes Father   . Hyperlipidemia Father   . Hypertension Father   . Stroke Father   . Anemia Mother   . Arrhythmia Mother   . Diabetes Mother   . Hyperlipidemia Mother   . Hypertension Mother   . Arrhythmia Sister   . Arrhythmia Brother   . Hypertension Sister   . Hypertension Sister   . Hypertension Brother   . Hypertension Brother    Past Surgical History:  Procedure Laterality Date  . MEDIAL COLLATERAL LIGAMENT AND LATERAL COLLATERAL LIGAMENT REPAIR, KNEE  2004   right      Vanessa Kick, MD 05/19/18 1510

## 2018-05-19 NOTE — Discharge Instructions (Addendum)
Start taking Meloxicam that you have at home.  Be aware, pain medications may cause drowsiness. Please do not drive, operate heavy machinery or make important decisions while on this medication, it can cloud your judgement.

## 2018-05-19 NOTE — ED Triage Notes (Signed)
Pt  cc he has a on going left knee issues.  Pt has had problems with his knee for years.

## 2019-09-26 ENCOUNTER — Inpatient Hospital Stay (HOSPITAL_COMMUNITY)
Admission: EM | Admit: 2019-09-26 | Discharge: 2019-09-28 | DRG: 812 | Disposition: A | Discharge status: Home / Self Care | Payer: No Typology Code available for payment source | Source: Ambulatory Visit | Attending: Internal Medicine | Admitting: Internal Medicine

## 2019-09-26 ENCOUNTER — Encounter (HOSPITAL_COMMUNITY): Payer: Self-pay | Admitting: Emergency Medicine

## 2019-09-26 DIAGNOSIS — K922 Gastrointestinal hemorrhage, unspecified: Secondary | ICD-10-CM | POA: Diagnosis not present

## 2019-09-26 DIAGNOSIS — Z79899 Other long term (current) drug therapy: Secondary | ICD-10-CM

## 2019-09-26 DIAGNOSIS — I10 Essential (primary) hypertension: Secondary | ICD-10-CM | POA: Diagnosis present

## 2019-09-26 DIAGNOSIS — Z83438 Family history of other disorder of lipoprotein metabolism and other lipidemia: Secondary | ICD-10-CM

## 2019-09-26 DIAGNOSIS — E876 Hypokalemia: Secondary | ICD-10-CM | POA: Diagnosis present

## 2019-09-26 DIAGNOSIS — Z8249 Family history of ischemic heart disease and other diseases of the circulatory system: Secondary | ICD-10-CM

## 2019-09-26 DIAGNOSIS — D649 Anemia, unspecified: Secondary | ICD-10-CM | POA: Diagnosis not present

## 2019-09-26 DIAGNOSIS — D509 Iron deficiency anemia, unspecified: Secondary | ICD-10-CM | POA: Diagnosis not present

## 2019-09-26 DIAGNOSIS — Z7982 Long term (current) use of aspirin: Secondary | ICD-10-CM

## 2019-09-26 DIAGNOSIS — Z833 Family history of diabetes mellitus: Secondary | ICD-10-CM

## 2019-09-26 DIAGNOSIS — Z823 Family history of stroke: Secondary | ICD-10-CM

## 2019-09-26 DIAGNOSIS — D500 Iron deficiency anemia secondary to blood loss (chronic): Principal | ICD-10-CM | POA: Diagnosis present

## 2019-09-26 DIAGNOSIS — K31819 Angiodysplasia of stomach and duodenum without bleeding: Secondary | ICD-10-CM | POA: Diagnosis present

## 2019-09-26 DIAGNOSIS — Z20822 Contact with and (suspected) exposure to covid-19: Secondary | ICD-10-CM | POA: Diagnosis present

## 2019-09-26 DIAGNOSIS — Z87891 Personal history of nicotine dependence: Secondary | ICD-10-CM

## 2019-09-26 LAB — CBC
HCT: 22.1 % — ABNORMAL LOW (ref 39.0–52.0)
Hemoglobin: 5.7 g/dL — CL (ref 13.0–17.0)
MCH: 17.3 pg — ABNORMAL LOW (ref 26.0–34.0)
MCHC: 25.8 g/dL — ABNORMAL LOW (ref 30.0–36.0)
MCV: 67.2 fL — ABNORMAL LOW (ref 80.0–100.0)
Platelets: 210 10*3/uL (ref 150–400)
RBC: 3.29 MIL/uL — ABNORMAL LOW (ref 4.22–5.81)
RDW: 17.8 % — ABNORMAL HIGH (ref 11.5–15.5)
WBC: 5.8 10*3/uL (ref 4.0–10.5)
nRBC: 0 % (ref 0.0–0.2)

## 2019-09-26 LAB — RETICULOCYTES
Immature Retic Fract: 25.8 % — ABNORMAL HIGH (ref 2.3–15.9)
RBC.: 3.02 MIL/uL — ABNORMAL LOW (ref 4.22–5.81)
Retic Count, Absolute: 51.3 10*3/uL (ref 19.0–186.0)
Retic Ct Pct: 1.7 % (ref 0.4–3.1)

## 2019-09-26 LAB — VITAMIN B12: Vitamin B-12: 319 pg/mL (ref 180–914)

## 2019-09-26 LAB — COMPREHENSIVE METABOLIC PANEL
ALT: 48 U/L — ABNORMAL HIGH (ref 0–44)
AST: 73 U/L — ABNORMAL HIGH (ref 15–41)
Albumin: 3.8 g/dL (ref 3.5–5.0)
Alkaline Phosphatase: 88 U/L (ref 38–126)
Anion gap: 11 (ref 5–15)
BUN: 13 mg/dL (ref 6–20)
CO2: 24 mmol/L (ref 22–32)
Calcium: 9.2 mg/dL (ref 8.9–10.3)
Chloride: 104 mmol/L (ref 98–111)
Creatinine, Ser: 1.13 mg/dL (ref 0.61–1.24)
GFR calc Af Amer: >60 mL/min (ref >60)
GFR calc non Af Amer: >60 mL/min (ref >60)
Glucose, Bld: 104 mg/dL — ABNORMAL HIGH (ref 70–99)
Potassium: 3.3 mmol/L — ABNORMAL LOW (ref 3.5–5.1)
Sodium: 139 mmol/L (ref 135–145)
Total Bilirubin: 0.7 mg/dL (ref 0.3–1.2)
Total Protein: 7.1 g/dL (ref 6.5–8.1)

## 2019-09-26 LAB — FOLATE: Folate: 8.7 ng/mL (ref >5.9)

## 2019-09-26 LAB — IRON AND TIBC
Iron: 15 ug/dL — ABNORMAL LOW (ref 45–182)
Saturation Ratios: 3 % — ABNORMAL LOW (ref 17.9–39.5)
TIBC: 529 ug/dL — ABNORMAL HIGH (ref 250–450)
UIBC: 514 ug/dL

## 2019-09-26 LAB — POC OCCULT BLOOD, ED: Fecal Occult Bld: POSITIVE — AB

## 2019-09-26 LAB — ABO/RH: ABO/RH(D): O POS

## 2019-09-26 LAB — FERRITIN: Ferritin: 6 ng/mL — ABNORMAL LOW (ref 24–336)

## 2019-09-26 LAB — SARS CORONAVIRUS 2 BY RT PCR (HOSPITAL ORDER, PERFORMED IN ~~LOC~~ HOSPITAL LAB): SARS Coronavirus 2: NEGATIVE

## 2019-09-26 LAB — PREPARE RBC (CROSSMATCH)

## 2019-09-26 MED ORDER — SODIUM CHLORIDE 0.9 % IV SOLN
10.0000 mL/h | Freq: Once | INTRAVENOUS | Status: AC
  Administered 2019-09-26: 10 mL/h via INTRAVENOUS

## 2019-09-26 MED ORDER — MELATONIN 3 MG PO TABS
3.0000 mg | ORAL_TABLET | Freq: Every day | ORAL | Status: DC
  Administered 2019-09-26 – 2019-09-27 (×2): 3 mg via ORAL
  Filled 2019-09-26 (×3): qty 1

## 2019-09-26 MED ORDER — HYDRALAZINE HCL 20 MG/ML IJ SOLN: 10.0000 mg | INTRAMUSCULAR | Status: DC | PRN

## 2019-09-26 MED ORDER — ONDANSETRON HCL 4 MG PO TABS: 4.0000 mg | ORAL_TABLET | Freq: Four times a day (QID) | ORAL | Status: DC | PRN

## 2019-09-26 MED ORDER — PANTOPRAZOLE SODIUM 40 MG IV SOLR
40.0000 mg | Freq: Two times a day (BID) | INTRAVENOUS | Status: DC
  Administered 2019-09-26 – 2019-09-28 (×4): 40 mg via INTRAVENOUS
  Filled 2019-09-26 (×5): qty 40

## 2019-09-26 MED ORDER — ONDANSETRON HCL 4 MG/2ML IJ SOLN: 4.0000 mg | Freq: Four times a day (QID) | INTRAMUSCULAR | Status: DC | PRN

## 2019-09-26 MED ORDER — TRAZODONE HCL 100 MG PO TABS
100.0000 mg | ORAL_TABLET | Freq: Every evening | ORAL | Status: DC | PRN
  Administered 2019-09-26: 100 mg via ORAL
  Filled 2019-09-26: qty 2

## 2019-09-26 NOTE — ED Triage Notes (Signed)
Patient sent by [facility] for low hgb. States he has been "chewing a lot of ice" and was told that was a sign of low hgb so he scheduled appointment. Reports dizziness. Denies rectal bleeding, hematuria, hematemesis.

## 2019-09-26 NOTE — ED Notes (Signed)
Pt endorses drinking 2 beers daily, no h/o liver problems

## 2019-09-26 NOTE — H&P (Addendum)
History and Physical    SANAV REMER VOJ:[id number] DOB: Aug 06, 1965 DOA: 09/26/2019  PCP: [facility]  Patient coming from: Home.  Chief Complaint: Low hemoglobin.  HPI: Sean Haynes is a 54 y.o. male with history of hypertension drinks alcohol every day has been experiencing symptoms of pica where patient wants to eat ice every day and was told that he has to check for iron deficiency and went to his primary care physician who did blood tests and was told that he has low hemoglobin and come to the ER.  Patient otherwise denies any chest pain shortness of breath or noticed any black stools or blood in the stools.  Denies taking any NSAIDs.  ED Course: In the ER patient was stool for occult blood positive hemoglobin was 5.7 with ferritin of 6.  Patient was started on 2 units PRBC transfusion admitted for symptomatic anemia and GI bleed.  Potassium was 3.3 AST was 73 ALT 48.  Covid test was negative.  Review of Systems: As per HPI, rest all negative.   Past Medical History:  Diagnosis Date  . Hyperlipidemia   . Hypertension     Past Surgical History:  Procedure Laterality Date  . MEDIAL COLLATERAL LIGAMENT AND LATERAL COLLATERAL LIGAMENT REPAIR, KNEE  2004   right     reports that he has quit smoking. His smoking use included cigarettes. He has a 68.00 pack-year smoking history. He has never used smokeless tobacco. He reports current alcohol use. He reports that he does not use drugs.  No Known Allergies  Family History  Problem Relation Age of Onset  . Diabetes Father   . Hyperlipidemia Father   . Hypertension Father   . Stroke Father   . Anemia Mother   . Arrhythmia Mother   . Diabetes Mother   . Hyperlipidemia Mother   . Hypertension Mother   . Arrhythmia Sister   . Arrhythmia Brother   . Hypertension Sister   . Hypertension Sister   . Hypertension Brother   . Hypertension Brother     Prior to Admission medications   Medication Sig Start Date  End Date Taking? Authorizing Provider  aspirin EC 81 MG tablet Take 81 mg by mouth daily. Swallow whole.   Yes [provider]  hydrochlorothiazide (HYDRODIURIL) 25 MG tablet Take 1 tablet (25 mg total) by mouth daily. Patient taking differently: Take 12.5 mg by mouth daily.  11/15/12  Yes Dunn, Areta Haber, PA-C  losartan (COZAAR) 100 MG tablet Take 100 mg by mouth daily.  08/01/19  Yes [provider]  melatonin 3 MG TABS tablet Take 3 mg by mouth at bedtime.   Yes [provider]  pantoprazole (PROTONIX) 40 MG tablet Take 40 mg by mouth daily. 08/01/19  Yes [provider]  traZODone (DESYREL) 100 MG tablet Take 100 mg by mouth at bedtime as needed for sleep (insomnia).   Yes [provider]  chlorthalidone (HYGROTON) 25 MG tablet Take 12.5 mg by mouth every morning. Patient not taking: Reported on 09/26/2019 08/03/19   [provider]  traMADol (ULTRAM) 50 MG tablet Take 1 tablet (50 mg total) by mouth every 6 (six) hours as needed. Patient not taking: Reported on 09/26/2019 05/19/18   Vanessa Kick, MD    Physical Exam: Constitutional: Moderately built and nourished. Vitals:   09/26/19 2130 09/26/19 2145 09/26/19 2200 09/26/19 2250  BP: (!) 164/81 (!) 155/63 (!) 124/100   Pulse: 83 75 81   Resp: 20  18 18   Temp:    99 F (37.2 C)  TempSrc:    Oral  SpO2: 99% 98% 99%   Weight:      Height:       Eyes: Anicteric no pallor. ENMT: No discharge from the ears eyes nose or mouth. Neck: No mass felt.  No neck rigidity. Respiratory: No rhonchi or crepitations. Cardiovascular: S1-S2 heard. Abdomen: Soft nontender bowel sounds present. Musculoskeletal: No edema. Skin: No rash. Neurologic: Alert awake oriented to time place and person.  Moves all extremities. Psychiatric: Appears normal per normal affect.   Labs on Admission: I have personally reviewed following labs and imaging studies  CBC: Recent Labs  Lab 09/26/19 1738  WBC 5.8  HGB  5.7*  HCT 22.1*  MCV 67.2*  PLT 573   Basic Metabolic Panel: Recent Labs  Lab 09/26/19 1738  NA 139  K 3.3*  CL 104  CO2 24  GLUCOSE 104*  BUN 13  CREATININE 1.13  CALCIUM 9.2   GFR: Estimated Creatinine Clearance: 108.4 mL/min (by C-G formula based on SCr of 1.13 mg/dL). Liver Function Tests: Recent Labs  Lab 09/26/19 1738  AST 73*  ALT 48*  ALKPHOS 88  BILITOT 0.7  PROT 7.1  ALBUMIN 3.8   No results for input(s): LIPASE, AMYLASE in the last 168 hours. No results for input(s): AMMONIA in the last 168 hours. Coagulation Profile: No results for input(s): INR, PROTIME in the last 168 hours. Cardiac Enzymes: No results for input(s): CKTOTAL, CKMB, CKMBINDEX, TROPONINI in the last 168 hours. BNP (last 3 results) No results for input(s): PROBNP in the last 8760 hours. HbA1C: No results for input(s): HGBA1C in the last 72 hours. CBG: No results for input(s): GLUCAP in the last 168 hours. Lipid Profile: No results for input(s): CHOL, HDL, LDLCALC, TRIG, CHOLHDL, LDLDIRECT in the last 72 hours. Thyroid Function Tests: No results for input(s): TSH, T4TOTAL, FREET4, T3FREE, THYROIDAB in the last 72 hours. Anemia Panel: Recent Labs    09/26/19 2030  VITAMINB12 319  FOLATE 8.7  FERRITIN 6*  TIBC 529*  IRON 15*  RETICCTPCT 1.7   Urine analysis: No results found for: COLORURINE, APPEARANCEUR, LABSPEC, PHURINE, GLUCOSEU, HGBUR, BILIRUBINUR, KETONESUR, PROTEINUR, UROBILINOGEN, NITRITE, LEUKOCYTESUR Sepsis Labs: @LABRCNTIP (procalcitonin:4,lacticidven:4) ) Recent Results (from the past 240 hour(s))  SARS Coronavirus 2 by RT PCR (hospital order, performed in [facility] hospital lab) Nasopharyngeal Nasopharyngeal Swab     Status: None   Collection Time: 09/26/19  8:01 PM   Specimen: Nasopharyngeal Swab  Result Value Ref Range Status   SARS Coronavirus 2 NEGATIVE NEGATIVE Final    Comment: (NOTE) SARS-CoV-2 target nucleic acids are NOT DETECTED.  The SARS-CoV-2 RNA  is generally detectable in upper and lower respiratory specimens during the acute phase of infection. The lowest concentration of SARS-CoV-2 viral copies this assay can detect is 250 copies / mL. A negative result does not preclude SARS-CoV-2 infection and should not be used as the sole basis for treatment or other patient management decisions.  A negative result may occur with improper specimen collection / handling, submission of specimen other than nasopharyngeal swab, presence of viral mutation(s) within the areas targeted by this assay, and inadequate number of viral copies (<250 copies / mL). A negative result must be combined with clinical observations, patient history, and epidemiological information.  Fact Sheet for Patients:   StrictlyIdeas.no  Fact Sheet for Healthcare Providers: BankingDealers.co.za  This test is not yet approved or  cleared by the [place]  States FDA and has been authorized for detection and/or diagnosis of SARS-CoV-2 by FDA under an Emergency Use Authorization (EUA).  This EUA will remain in effect (meaning this test can be used) for the duration of the COVID-19 declaration under Section 564(b)(1) of the Act, 21 U.S.C. section 360bbb-3(b)(1), unless the authorization is terminated or revoked sooner.  Performed at [facility], [place] [address] [place], [place] [address]      Radiological Exams on Admission: No results found.   Assessment/Plan Principal Problem:   Symptomatic anemia Active Problems:   Hypertension   GI bleed    1. Symptomatic anemia likely from chronic GI bleed with iron deficiency for which 2 units of PRBC transfusion has been ordered.  Recheck metabolic panel in a.m. 2. GI bleed likely acute on chronic.  We will keep patient n.p.o. on PPI and consult [facility] in the morning.  Patient states he did have colonoscopy by [facility] around 4 years ago which was  unremarkable. 3. Hypertension we will keep patient on as needed IV hydralazine for systolic blood pressure more than 160 since patient is n.p.o. 4. Patient drinks alcohol every day so we will keep patient on CIWA protocol and social work consult. 5. Elevated LFTs likely from alcoholism follow LFTs.   DVT prophylaxis: SCDs for now since patient has GI bleed will avoid anticoagulation. Code Status: Full code. Family Communication: Discussed with patient. Disposition Plan: Home. Consults called: None. Admission status: Observation.   Rise Patience MD [facility] Pager [phone].  If 7PM-7AM, please contact night-coverage [email or web address] Password [facility]  09/26/2019, 11:02 PM

## 2019-09-26 NOTE — ED Notes (Signed)
Dr Darl Householder aware Hgb 5.7 Will make priority for next room

## 2019-09-26 NOTE — ED Provider Notes (Signed)
[facility]  SURGICAL Provider Note   CSN: [id number] Arrival date & time: 09/26/19  1715     History Chief Complaint  Patient presents with   Abnormal Lab    Sean Haynes is a 54 y.o. male.  HPI     Patient presents with concern of fatigue and substantial ice intake.  Patient is here with his wife who assists with HPI. Patient is a patient of the [facility]. With the above concerns he went to his physician's office today. Soon after leaving he was made aware of abnormal lab findings and was sent here for evaluation. He denies pain, syncope, melena, hematemesis, hematuria. Has no history of GI bleed. Last colonoscopy was 5 years ago, unremarkable, reportedly.   Past Medical History:  Diagnosis Date   Hyperlipidemia    Hypertension     Patient Active Problem List   Diagnosis Date Noted   Symptomatic anemia 09/26/2019   GI bleed 09/26/2019   Nonspecific abnormal electrocardiogram (ECG) (EKG) 12/28/2010   Hypertension 12/28/2010   Tobacco abuse 12/28/2010    Past Surgical History:  Procedure Laterality Date   MEDIAL COLLATERAL LIGAMENT AND LATERAL COLLATERAL LIGAMENT REPAIR, KNEE  2004   right       Family History  Problem Relation Age of Onset   Diabetes Father    Hyperlipidemia Father    Hypertension Father    Stroke Father    Anemia Mother    Arrhythmia Mother    Diabetes Mother    Hyperlipidemia Mother    Hypertension Mother    Arrhythmia Sister    Arrhythmia Brother    Hypertension Sister    Hypertension Sister    Hypertension Brother    Hypertension Brother     Social History   Tobacco Use   Smoking status: Former Smoker    Packs/day: 2.00    Years: 34.00    Pack years: 68.00    Types: Cigarettes   Smokeless tobacco: Never Used  Substance Use Topics   Alcohol use: Yes    Comment: 6 pack of beer and half a fifth per week   Drug use: No    Home  Medications Prior to Admission medications   Medication Sig Start Date End Date Taking? Authorizing Provider  aspirin EC 81 MG tablet Take 81 mg by mouth daily. Swallow whole.   Yes [provider]  hydrochlorothiazide (HYDRODIURIL) 25 MG tablet Take 1 tablet (25 mg total) by mouth daily. Patient taking differently: Take 12.5 mg by mouth daily.  11/15/12  Yes Dunn, Areta Haber, PA-C  losartan (COZAAR) 100 MG tablet Take 100 mg by mouth daily.  08/01/19  Yes [provider]  melatonin 3 MG TABS tablet Take 3 mg by mouth at bedtime.   Yes [provider]  pantoprazole (PROTONIX) 40 MG tablet Take 40 mg by mouth daily. 08/01/19  Yes [provider]  traZODone (DESYREL) 100 MG tablet Take 100 mg by mouth at bedtime as needed for sleep (insomnia).   Yes [provider]  chlorthalidone (HYGROTON) 25 MG tablet Take 12.5 mg by mouth every morning. Patient not taking: Reported on 09/26/2019 08/03/19   [provider]  traMADol (ULTRAM) 50 MG tablet Take 1 tablet (50 mg total) by mouth every 6 (six) hours as needed. Patient not taking: Reported on 09/26/2019 05/19/18   Vanessa Kick, MD    Allergies    Patient has no known allergies.  Review of Systems   Review  of Systems  Constitutional:       Per HPI, otherwise negative  HENT:       Per HPI, otherwise negative  Respiratory:       Per HPI, otherwise negative  Cardiovascular:       Per HPI, otherwise negative  Gastrointestinal: Negative for vomiting.  Endocrine:       Negative aside from HPI  Genitourinary:       Neg aside from HPI   Musculoskeletal:       Per HPI, otherwise negative  Skin: Negative.   Neurological: Negative for syncope.    Physical Exam Updated Vital Signs BP (!) 166/93 (BP Location: Left Arm)    Pulse 93    Temp 99 F (37.2 C) (Oral)    Resp 20    Ht 6\' 4"  (1.93 m)    Wt 129.3 kg    SpO2 100%    BMI 34.69 kg/m   Physical Exam Vitals and nursing note reviewed.   Constitutional:      General: He is not in acute distress.    Appearance: He is well-developed.  HENT:     Head: Normocephalic and atraumatic.  Eyes:     Conjunctiva/sclera: Conjunctivae normal.  Cardiovascular:     Rate and Rhythm: Normal rate and regular rhythm.  Pulmonary:     Effort: Pulmonary effort is normal. No respiratory distress.     Breath sounds: No stridor.  Abdominal:     General: There is no distension.     Tenderness: There is no abdominal tenderness.  Genitourinary:    Rectum: Guaiac result positive. No tenderness or external hemorrhoid. Normal anal tone.  Skin:    General: Skin is warm and dry.  Neurological:     Mental Status: He is alert and oriented to person, place, and time.      ED Results / Procedures / Treatments   Labs (all labs ordered are listed, but only abnormal results are displayed) Labs Reviewed  COMPREHENSIVE METABOLIC PANEL - Abnormal; Notable for the following components:      Result Value   Potassium 3.3 (*)    Glucose, Bld 104 (*)    AST 73 (*)    ALT 48 (*)    All other components within normal limits  CBC - Abnormal; Notable for the following components:   RBC 3.29 (*)    Hemoglobin 5.7 (*)    HCT 22.1 (*)    MCV 67.2 (*)    MCH 17.3 (*)    MCHC 25.8 (*)    RDW 17.8 (*)    All other components within normal limits  IRON AND TIBC - Abnormal; Notable for the following components:   Iron 15 (*)    TIBC 529 (*)    Saturation Ratios 3 (*)    All other components within normal limits  FERRITIN - Abnormal; Notable for the following components:   Ferritin 6 (*)    All other components within normal limits  RETICULOCYTES - Abnormal; Notable for the following components:   RBC. 3.02 (*)    Immature Retic Fract 25.8 (*)    All other components within normal limits  POC OCCULT BLOOD, ED - Abnormal; Notable for the following components:   Fecal Occult Bld POSITIVE (*)    All other components within normal limits  SARS  CORONAVIRUS 2 BY RT PCR (HOSPITAL ORDER, [facility])  VITAMIN B12  FOLATE  HIV ANTIBODY (ROUTINE TESTING W REFLEX)  BASIC  METABOLIC PANEL  CBC  TYPE AND SCREEN  ABO/RH  PREPARE RBC (CROSSMATCH)    Procedures Procedures (including critical care time)  Medications Ordered in ED Medications  traZODone (DESYREL) tablet 100 mg (100 mg Oral Given 09/26/19 2317)  melatonin tablet 3 mg (has no administration in time range)  ondansetron (ZOFRAN) tablet 4 mg (has no administration in time range)    Or  ondansetron (ZOFRAN) injection 4 mg (has no administration in time range)  pantoprazole (PROTONIX) injection 40 mg (40 mg Intravenous Given 09/26/19 2317)  hydrALAZINE (APRESOLINE) injection 10 mg (has no administration in time range)  0.9 %  sodium chloride infusion (10 mL/hr Intravenous Bolus from Bag 09/26/19 2105)    ED Course  I have reviewed the triage vital signs and the nursing notes.  Pertinent labs & imaging results that were available during my care of the patient were reviewed by me and considered in my medical decision making (see chart for details).  Adult male with new ice consumption, fatigue presents with concern for possible anemia.  Consideration of GI bleed, hemolysis, myelodysplasia initially evaluated with repeat labs.  Initial findings concerning for hemoglobin 5.7.  Update:, Patient similar condition.  I discussed his case with the member of his GI team, they will follow as a consulting service tomorrow.  Patient and wife now present, we discussed importance of initiation of transfusion, to which they are amenable.  Patient continues to have no other pain, but given his critically abnormal hemoglobin value, need for transfusion, next a GI consult, he will be admitted for further monitoring, management.  Final Clinical Impression(s) / ED Diagnoses Final diagnoses:  Symptomatic anemia   MDM Number of Diagnoses or Management  Options Symptomatic anemia: new, needed workup   Amount and/or Complexity of Data Reviewed Clinical lab tests: reviewed Tests in the radiology section of CPT: reviewed Tests in the medicine section of CPT: reviewed Discussion of test results with the performing providers: yes Decide to obtain previous medical records or to obtain history from someone other than the patient: yes Obtain history from someone other than the patient: yes Review and summarize past medical records: yes Discuss the patient with other providers: yes  Risk of Complications, Morbidity, and/or Mortality Presenting problems: high Diagnostic procedures: high Management options: high  Critical Care Total time providing critical care: 30-74 minutes  Patient Progress Patient progress: stable    Carmin Muskrat, MD 09/26/19 2351

## 2019-09-27 ENCOUNTER — Inpatient Hospital Stay (HOSPITAL_COMMUNITY): Payer: No Typology Code available for payment source | Admitting: Certified Registered Nurse Anesthetist

## 2019-09-27 ENCOUNTER — Encounter (HOSPITAL_COMMUNITY)
Admission: EM | Disposition: A | Discharge status: Home / Self Care | Payer: Self-pay | Source: Ambulatory Visit | Attending: Internal Medicine

## 2019-09-27 ENCOUNTER — Encounter (HOSPITAL_COMMUNITY): Payer: Self-pay | Admitting: Internal Medicine

## 2019-09-27 DIAGNOSIS — K922 Gastrointestinal hemorrhage, unspecified: Secondary | ICD-10-CM | POA: Diagnosis present

## 2019-09-27 DIAGNOSIS — D509 Iron deficiency anemia, unspecified: Secondary | ICD-10-CM | POA: Diagnosis present

## 2019-09-27 DIAGNOSIS — Z7982 Long term (current) use of aspirin: Secondary | ICD-10-CM | POA: Diagnosis not present

## 2019-09-27 DIAGNOSIS — E876 Hypokalemia: Secondary | ICD-10-CM | POA: Diagnosis present

## 2019-09-27 DIAGNOSIS — D649 Anemia, unspecified: Secondary | ICD-10-CM

## 2019-09-27 DIAGNOSIS — Z823 Family history of stroke: Secondary | ICD-10-CM | POA: Diagnosis not present

## 2019-09-27 DIAGNOSIS — K31819 Angiodysplasia of stomach and duodenum without bleeding: Secondary | ICD-10-CM | POA: Diagnosis present

## 2019-09-27 DIAGNOSIS — Z833 Family history of diabetes mellitus: Secondary | ICD-10-CM | POA: Diagnosis not present

## 2019-09-27 DIAGNOSIS — Z83438 Family history of other disorder of lipoprotein metabolism and other lipidemia: Secondary | ICD-10-CM | POA: Diagnosis not present

## 2019-09-27 DIAGNOSIS — Z20822 Contact with and (suspected) exposure to covid-19: Secondary | ICD-10-CM | POA: Diagnosis present

## 2019-09-27 DIAGNOSIS — Z87891 Personal history of nicotine dependence: Secondary | ICD-10-CM | POA: Diagnosis not present

## 2019-09-27 DIAGNOSIS — Z8249 Family history of ischemic heart disease and other diseases of the circulatory system: Secondary | ICD-10-CM | POA: Diagnosis not present

## 2019-09-27 DIAGNOSIS — Z79899 Other long term (current) drug therapy: Secondary | ICD-10-CM | POA: Diagnosis not present

## 2019-09-27 DIAGNOSIS — D500 Iron deficiency anemia secondary to blood loss (chronic): Secondary | ICD-10-CM | POA: Diagnosis present

## 2019-09-27 DIAGNOSIS — I10 Essential (primary) hypertension: Secondary | ICD-10-CM | POA: Diagnosis present

## 2019-09-27 HISTORY — PX: GIVENS CAPSULE STUDY: SHX5432

## 2019-09-27 HISTORY — PX: ESOPHAGOGASTRODUODENOSCOPY (EGD) WITH PROPOFOL: SHX5813

## 2019-09-27 LAB — TYPE AND SCREEN
ABO/RH(D): O POS
Antibody Screen: NEGATIVE
Unit division: 0
Unit division: 0

## 2019-09-27 LAB — HIV ANTIBODY (ROUTINE TESTING W REFLEX): HIV Screen 4th Generation wRfx: NONREACTIVE

## 2019-09-27 LAB — BPAM RBC
Blood Product Expiration Date: 202108052359
Blood Product Expiration Date: 202108052359
ISSUE DATE / TIME: 202107082038
ISSUE DATE / TIME: 202107082258
Unit Type and Rh: 5100
Unit Type and Rh: 5100

## 2019-09-27 LAB — BASIC METABOLIC PANEL
Anion gap: 9 (ref 5–15)
BUN: 11 mg/dL (ref 6–20)
CO2: 24 mmol/L (ref 22–32)
Calcium: 8.6 mg/dL — ABNORMAL LOW (ref 8.9–10.3)
Chloride: 106 mmol/L (ref 98–111)
Creatinine, Ser: 1.06 mg/dL (ref 0.61–1.24)
GFR calc Af Amer: >60 mL/min (ref >60)
GFR calc non Af Amer: >60 mL/min (ref >60)
Glucose, Bld: 101 mg/dL — ABNORMAL HIGH (ref 70–99)
Potassium: 3.4 mmol/L — ABNORMAL LOW (ref 3.5–5.1)
Sodium: 139 mmol/L (ref 135–145)

## 2019-09-27 LAB — CBC
HCT: 24 % — ABNORMAL LOW (ref 39.0–52.0)
Hemoglobin: 6.6 g/dL — CL (ref 13.0–17.0)
MCH: 19.1 pg — ABNORMAL LOW (ref 26.0–34.0)
MCHC: 27.5 g/dL — ABNORMAL LOW (ref 30.0–36.0)
MCV: 69.4 fL — ABNORMAL LOW (ref 80.0–100.0)
Platelets: 181 10*3/uL (ref 150–400)
RBC: 3.46 MIL/uL — ABNORMAL LOW (ref 4.22–5.81)
RDW: 19.8 % — ABNORMAL HIGH (ref 11.5–15.5)
WBC: 4.8 10*3/uL (ref 4.0–10.5)
nRBC: 0 % (ref 0.0–0.2)

## 2019-09-27 LAB — HEMOGLOBIN AND HEMATOCRIT, BLOOD
HCT: 26 % — ABNORMAL LOW (ref 39.0–52.0)
Hemoglobin: 7.1 g/dL — ABNORMAL LOW (ref 13.0–17.0)

## 2019-09-27 SURGERY — ESOPHAGOGASTRODUODENOSCOPY (EGD) WITH PROPOFOL
Anesthesia: Monitor Anesthesia Care

## 2019-09-27 MED ORDER — SODIUM CHLORIDE 0.9 % IV SOLN
510.0000 mg | Freq: Once | INTRAVENOUS | Status: AC
  Administered 2019-09-27: 510 mg via INTRAVENOUS
  Filled 2019-09-27: qty 17

## 2019-09-27 MED ORDER — LORAZEPAM 2 MG/ML IJ SOLN
1.0000 mg | INTRAMUSCULAR | Status: DC | PRN
  Administered 2019-09-27: 1 mg via INTRAVENOUS
  Filled 2019-09-27: qty 1

## 2019-09-27 MED ORDER — POTASSIUM CHLORIDE CRYS ER 20 MEQ PO TBCR
40.0000 meq | EXTENDED_RELEASE_TABLET | Freq: Two times a day (BID) | ORAL | Status: DC
  Administered 2019-09-27 – 2019-09-28 (×3): 40 meq via ORAL
  Filled 2019-09-27 (×3): qty 2

## 2019-09-27 MED ORDER — PROPOFOL 500 MG/50ML IV EMUL
INTRAVENOUS | Status: DC | PRN
  Administered 2019-09-27: 100 ug/kg/min via INTRAVENOUS

## 2019-09-27 MED ORDER — LACTATED RINGERS IV SOLN: INTRAVENOUS | Status: DC | PRN

## 2019-09-27 MED ORDER — SODIUM CHLORIDE 0.9 % IV SOLN: INTRAVENOUS | Status: DC

## 2019-09-27 MED ORDER — PROPOFOL 10 MG/ML IV BOLUS
INTRAVENOUS | Status: DC | PRN
  Administered 2019-09-27: 30 mg via INTRAVENOUS
  Administered 2019-09-27: 20 mg via INTRAVENOUS
  Administered 2019-09-27: 30 mg via INTRAVENOUS
  Administered 2019-09-27: 20 mg via INTRAVENOUS
  Administered 2019-09-27: 30 mg via INTRAVENOUS

## 2019-09-27 MED ORDER — LORAZEPAM 1 MG PO TABS: 1.0000 mg | ORAL_TABLET | ORAL | Status: DC | PRN

## 2019-09-27 MED ORDER — THIAMINE HCL 100 MG PO TABS
100.0000 mg | ORAL_TABLET | Freq: Every day | ORAL | Status: DC
  Administered 2019-09-27 – 2019-09-28 (×2): 100 mg via ORAL
  Filled 2019-09-27 (×2): qty 1

## 2019-09-27 MED ORDER — THIAMINE HCL 100 MG/ML IJ SOLN: 100.0000 mg | Freq: Every day | INTRAMUSCULAR | Status: DC

## 2019-09-27 MED ORDER — ADULT MULTIVITAMIN W/MINERALS CH
1.0000 | ORAL_TABLET | Freq: Every day | ORAL | Status: DC
  Administered 2019-09-27 – 2019-09-28 (×2): 1 via ORAL
  Filled 2019-09-27 (×2): qty 1

## 2019-09-27 MED ORDER — FOLIC ACID 1 MG PO TABS
1.0000 mg | ORAL_TABLET | Freq: Every day | ORAL | Status: DC
  Administered 2019-09-27 – 2019-09-28 (×2): 1 mg via ORAL
  Filled 2019-09-27 (×2): qty 1

## 2019-09-27 SURGICAL SUPPLY — 15 items

## 2019-09-27 NOTE — Consult Note (Signed)
Referring Provider: Dr.  Darl Householder Care Physician:  [facility] Primary Gastroenterologist:  Dr.  Luiz Iron for Consultation:  Anemia   HPI: Sean Haynes is a 54 y.o. male    Past Medical History:  Diagnosis Date  . Hyperlipidemia   . Hypertension     Past Surgical History:  Procedure Laterality Date  . MEDIAL COLLATERAL LIGAMENT AND LATERAL COLLATERAL LIGAMENT REPAIR, KNEE  2004   right    Prior to Admission medications   Medication Sig Start Date End Date Taking? Authorizing Provider  aspirin EC 81 MG tablet Take 81 mg by mouth daily. Swallow whole.   Yes [provider]  hydrochlorothiazide (HYDRODIURIL) 25 MG tablet Take 1 tablet (25 mg total) by mouth daily. Patient taking differently: Take 12.5 mg by mouth daily.  11/15/12  Yes Dunn, Areta Haber, PA-C  losartan (COZAAR) 100 MG tablet Take 100 mg by mouth daily.  08/01/19  Yes [provider]  melatonin 3 MG TABS tablet Take 3 mg by mouth at bedtime.   Yes [provider]  pantoprazole (PROTONIX) 40 MG tablet Take 40 mg by mouth daily. 08/01/19  Yes [provider]  traZODone (DESYREL) 100 MG tablet Take 100 mg by mouth at bedtime as needed for sleep (insomnia).   Yes [provider]  chlorthalidone (HYGROTON) 25 MG tablet Take 12.5 mg by mouth every morning. Patient not taking: Reported on 09/26/2019 08/03/19   [provider]  traMADol (ULTRAM) 50 MG tablet Take 1 tablet (50 mg total) by mouth every 6 (six) hours as needed. Patient not taking: Reported on 09/26/2019 05/19/18   Vanessa Kick, MD    Current Facility-Administered Medications  Medication Dose Route Frequency Provider Last Rate Last Admin  . folic acid (FOLVITE) tablet 1 mg  1 mg Oral Daily Rise Patience, MD      . hydrALAZINE (APRESOLINE) injection 10 mg  10 mg Intravenous Q4H PRN Rise Patience, MD      . LORazepam (ATIVAN) tablet 1-4 mg  1-4 mg Oral Q1H PRN Rise Patience, MD        Or  . LORazepam (ATIVAN) injection 1-4 mg  1-4 mg Intravenous Q1H PRN Rise Patience, MD      . melatonin tablet 3 mg  3 mg Oral QHS Rise Patience, MD   3 mg at 09/26/19 2354  . multivitamin with minerals tablet 1 tablet  1 tablet Oral Daily Rise Patience, MD      . ondansetron [facility]) tablet 4 mg  4 mg Oral Q6H PRN Rise Patience, MD       Or  . ondansetron [facility]) injection 4 mg  4 mg Intravenous Q6H PRN Rise Patience, MD      . pantoprazole (PROTONIX) injection 40 mg  40 mg Intravenous Q12H Rise Patience, MD   40 mg at 09/26/19 2317  . thiamine tablet 100 mg  100 mg Oral Daily Rise Patience, MD       Or  . thiamine (B-1) injection 100 mg  100 mg Intravenous Daily Rise Patience, MD      . traZODone (DESYREL) tablet 100 mg  100 mg Oral QHS PRN Rise Patience, MD   100 mg at 09/26/19 2317    Allergies as of 09/26/2019  . (No Known Allergies)    Family History  Problem Relation Age of Onset  . Diabetes Father   . Hyperlipidemia Father   . Hypertension  Father   . Stroke Father   . Anemia Mother   . Arrhythmia Mother   . Diabetes Mother   . Hyperlipidemia Mother   . Hypertension Mother   . Arrhythmia Sister   . Arrhythmia Brother   . Hypertension Sister   . Hypertension Sister   . Hypertension Brother   . Hypertension Brother     Social History   Socioeconomic History  . Marital status: Married    Spouse name: Not on file  . Number of children: 2  . Years of education: Not on file  . Highest education level: Not on file  Occupational History    Employer: [employer]  Tobacco Use  . Smoking status: Former Smoker    Packs/day: 2.00    Years: 34.00    Pack years: 68.00    Types: Cigarettes  . Smokeless tobacco: Never Used  Substance and Sexual Activity  . Alcohol use: Yes    Comment: 6 pack of beer and half a fifth per week  . Drug use: No  . Sexual activity: Not on file  Other Topics Concern  . Not  on file  Social History Narrative  . Not on file   Social Determinants of Health   Financial Resource Strain:   . Difficulty of Paying Living Expenses:   Food Insecurity:   . Worried About Charity fundraiser in the Last Year:   . Arboriculturist in the Last Year:   Transportation Needs:   . Film/video editor (Medical):   Marland Kitchen Lack of Transportation (Non-Medical):   Physical Activity:   . Days of Exercise per Week:   . Minutes of Exercise per Session:   Stress:   . Feeling of Stress :   Social Connections:   . Frequency of Communication with Friends and Family:   . Frequency of Social Gatherings with Friends and Family:   . Attends Religious Services:   . Active Member of Clubs or Organizations:   . Attends Archivist Meetings:   Marland Kitchen Marital Status:   Intimate Partner Violence:   . Fear of Current or Ex-Partner:   . Emotionally Abused:   Marland Kitchen Physically Abused:   . Sexually Abused:     Review of Systems: Gen: Denies fever, sweats or chills. No weight loss.  CV: Denies chest pain, palpitations or edema. Resp: Denies cough, shortness of breath of hemoptysis.  GI: Denies heartburn, dysphagia, stomach or lower abdominal pain. No diarrhea or constipation. No rectal bleeding or melena.   GU : Denies urinary burning, blood in urine, increased urinary frequency or incontinence. MS: Denies joint pain, muscles aches or weakness. Derm: Denies rash, itchiness, skin lesions or unhealing ulcers. Psych: Denies depression, anxiety, memory loss, suicidal ideation or confusion. Heme: Denies easy bruising, bleeding. Neuro:  Denies headaches, dizziness or paresthesias. Endo:  Denies any problems with DM, thyroid or adrenal function.  Physical Exam: Vital signs in last 24 hours: Temp:  [98.2 F (36.8 C)-99.6 F (37.6 C)] 98.2 F (36.8 C) (07/09 0415) Pulse Rate:  [73-93] 77 (07/09 0415) Resp:  [13-22] 19 (07/09 0415) BP: (124-167)/(63-100) 145/73 (07/09 0415) SpO2:  [98 %-100  %] 98 % (07/09 0415) Weight:  [174 kg-129.3 kg] 127 kg (07/08 2342) Last BM Date: 09/26/19 General:  Alert,  well-developed, well-nourished, pleasant and cooperative in NAD. Head:  Normocephalic and atraumatic. Eyes:  No scleral icterus. Conjunctiva pink. Ears:  Normal auditory acuity. Nose:  No deformity, discharge or lesions. Mouth:  Dentition intact. No ulcers or lesions.  Neck:  Supple. No lymphadenopathy or thyromegaly.  Lungs:   Heart:   Abdomen:   Rectal: Deferred. Musculoskeletal:  Symmetrical without gross deformities.  Pulses:  Normal pulses noted. Extremities:  Without clubbing or edema. Neurologic:  Alert and  oriented x4. No focal deficits.  Skin:  Intact without significant lesions or rashes. Psych:  Alert and cooperative. Normal mood and affect.  Intake/Output from previous day: 07/08 0701 - 07/09 0700 In: 630 [Blood:630] Out: 450 [Urine:450] Intake/Output this shift: No intake/output data recorded.  Lab Results: Recent Labs    09/26/19 1738 09/27/19 0813  WBC 5.8 4.8  HGB 5.7* 6.6*  HCT 22.1* 24.0*  PLT 210 181   BMET Recent Labs    09/26/19 1738 09/27/19 0813  NA 139 139  K 3.3* 3.4*  CL 104 106  CO2 24 24  GLUCOSE 104* 101*  BUN 13 11  CREATININE 1.13 1.06  CALCIUM 9.2 8.6*   LFT Recent Labs    09/26/19 1738  PROT 7.1  ALBUMIN 3.8  AST 73*  ALT 48*  ALKPHOS 88  BILITOT 0.7   PT/INR No results for input(s): LABPROT, INR in the last 72 hours. Hepatitis Panel No results for input(s): HEPBSAG, HCVAB, HEPAIGM, HEPBIGM in the last 72 hours.    Studies/Results: No results found.  IMPRESSION/PLAN:   Sean Haynes  09/27/2019, 9:08 AM

## 2019-09-27 NOTE — Consult Note (Signed)
Referring Provider: Dr. Sloan Leiter Primary Care Physician:  [facility] Primary Gastroenterologist:  Dr. Oletta Lamas  Reason for Consultation:  Anemia; Heme positive stool  HPI: Sean Haynes is a 54 y.o. male with pica for the past month and mild weakness without rectal bleeding, hematemesis, melena, N/V, abdominal pain, weight loss or change in bowel habits. Colonoscopyin 02/2015 showed sigmoid diverticulosis. Several alcoholic drinks per day. Aspirin 81 mg/day and otherwise denies NSAIDs. Hgb 6.6. Heme positive stool. Wife at bedside.  Past Medical History:  Diagnosis Date  . Hyperlipidemia   . Hypertension     Past Surgical History:  Procedure Laterality Date  . MEDIAL COLLATERAL LIGAMENT AND LATERAL COLLATERAL LIGAMENT REPAIR, KNEE  2004   right    Prior to Admission medications   Medication Sig Start Date End Date Taking? Authorizing Provider  aspirin EC 81 MG tablet Take 81 mg by mouth daily. Swallow whole.   Yes [provider]  hydrochlorothiazide (HYDRODIURIL) 25 MG tablet Take 1 tablet (25 mg total) by mouth daily. Patient taking differently: Take 12.5 mg by mouth daily.  11/15/12  Yes Dunn, Areta Haber, PA-C  losartan (COZAAR) 100 MG tablet Take 100 mg by mouth daily.  08/01/19  Yes [provider]  melatonin 3 MG TABS tablet Take 3 mg by mouth at bedtime.   Yes [provider]  pantoprazole (PROTONIX) 40 MG tablet Take 40 mg by mouth daily. 08/01/19  Yes [provider]  traZODone (DESYREL) 100 MG tablet Take 100 mg by mouth at bedtime as needed for sleep (insomnia).   Yes [provider]  chlorthalidone (HYGROTON) 25 MG tablet Take 12.5 mg by mouth every morning. Patient not taking: Reported on 09/26/2019 08/03/19   [provider]  traMADol (ULTRAM) 50 MG tablet Take 1 tablet (50 mg total) by mouth every 6 (six) hours as needed. Patient not taking: Reported on 09/26/2019 05/19/18   Vanessa Kick, MD    Scheduled  Meds: . folic acid  1 mg Oral Daily  . melatonin  3 mg Oral QHS  . multivitamin with minerals  1 tablet Oral Daily  . pantoprazole (PROTONIX) IV  40 mg Intravenous Q12H  . potassium chloride  40 mEq Oral BID  . thiamine  100 mg Oral Daily   Or  . thiamine  100 mg Intravenous Daily   Continuous Infusions: PRN Meds:.hydrALAZINE, LORazepam **OR** LORazepam, ondansetron **OR** ondansetron (ZOFRAN) IV, traZODone  Allergies as of 09/26/2019  . (No Known Allergies)    Family History  Problem Relation Age of Onset  . Diabetes Father   . Hyperlipidemia Father   . Hypertension Father   . Stroke Father   . Anemia Mother   . Arrhythmia Mother   . Diabetes Mother   . Hyperlipidemia Mother   . Hypertension Mother   . Arrhythmia Sister   . Arrhythmia Brother   . Hypertension Sister   . Hypertension Sister   . Hypertension Brother   . Hypertension Brother     Social History   Socioeconomic History  . Marital status: Married    Spouse name: Not on file  . Number of children: 2  . Years of education: Not on file  . Highest education level: Not on file  Occupational History    Employer: [employer]  Tobacco Use  . Smoking status: Former Smoker    Packs/day: 2.00    Years: 34.00    Pack years: 68.00    Types: Cigarettes  . Smokeless tobacco:  Never Used  Substance and Sexual Activity  . Alcohol use: Yes    Comment: 6 pack of beer and half a fifth per week  . Drug use: No  . Sexual activity: Not on file  Other Topics Concern  . Not on file  Social History Narrative  . Not on file   Social Determinants of Health   Financial Resource Strain:   . Difficulty of Paying Living Expenses:   Food Insecurity:   . Worried About Charity fundraiser in the Last Year:   . Arboriculturist in the Last Year:   Transportation Needs:   . Film/video editor (Medical):   Marland Kitchen Lack of Transportation (Non-Medical):   Physical Activity:   . Days of Exercise per Week:   . Minutes of  Exercise per Session:   Stress:   . Feeling of Stress :   Social Connections:   . Frequency of Communication with Friends and Family:   . Frequency of Social Gatherings with Friends and Family:   . Attends Religious Services:   . Active Member of Clubs or Organizations:   . Attends Archivist Meetings:   Marland Kitchen Marital Status:   Intimate Partner Violence:   . Fear of Current or Ex-Partner:   . Emotionally Abused:   Marland Kitchen Physically Abused:   . Sexually Abused:     Review of Systems: All negative except as stated above in HPI.  Physical Exam: Vital signs: Vitals:   09/27/19 0415 09/27/19 1006  BP: (!) 145/73 (!) 149/87  Pulse: 77 73  Resp: 19 18  Temp: 98.2 F (36.8 C) 98.3 F (36.8 C)  SpO2: 98% 99%   Last BM Date: 09/26/19 General:   Alert,  Well-developed, well-nourished, pleasant and cooperative in NAD Head: normocephalic, atraumatic Eyes: anicteric sclera ENT: oropharynx clear Neck: supple, nontender Lungs:  Clear throughout to auscultation.   No wheezes, crackles, or rhonchi. No acute distress. Heart:  Regular rate and rhythm; no murmurs, clicks, rubs,  or gallops. Abdomen: soft, nontender, nondistended, +BS  Rectal:  Deferred Ext: no edema  GI:  Lab Results: Recent Labs    09/26/19 1738 09/27/19 0813  WBC 5.8 4.8  HGB 5.7* 6.6*  HCT 22.1* 24.0*  PLT 210 181   BMET Recent Labs    09/26/19 1738 09/27/19 0813  NA 139 139  K 3.3* 3.4*  CL 104 106  CO2 24 24  GLUCOSE 104* 101*  BUN 13 11  CREATININE 1.13 1.06  CALCIUM 9.2 8.6*   LFT Recent Labs    09/26/19 1738  PROT 7.1  ALBUMIN 3.8  AST 73*  ALT 48*  ALKPHOS 88  BILITOT 0.7   PT/INR No results for input(s): LABPROT, INR in the last 72 hours.   Studies/Results: No results found.  Impression/Plan: Symptomatic anemia with weakness and pica and heme positive stool without overt bleeding. EGD today to evaluate for peptic ulcer disease and if EGD is unrevealing then will do a small  bowel capsule endoscopy. I do not think an updated colonoscopy is needed but if EGD and capsule are unrevealing, then that will need to be considered. NPO for EGD today.    LOS: 0 days   [name]  09/27/2019, 11:37 AM  Questions please call [phone]

## 2019-09-27 NOTE — Progress Notes (Signed)
Patient swallowed pill capsule at 1600 and tolerated well.

## 2019-09-27 NOTE — Progress Notes (Addendum)
Received pt from ED. Pt alert and oriented x4. Denies c/o pain. Skin intact. Second unit of blood currently transfusing. Oriented to room and call bell. Also informed pt and family of visitation policy.

## 2019-09-27 NOTE — Interval H&P Note (Signed)
History and Physical Interval Note:  09/27/2019 3:05 PM  Sean Haynes  has presented today for surgery, with the diagnosis of Anemia.  The various methods of treatment have been discussed with the patient and family. After consideration of risks, benefits and other options for treatment, the patient has consented to  Procedure(s): ESOPHAGOGASTRODUODENOSCOPY (EGD) WITH PROPOFOL (N/A) as a surgical intervention.  The patient's history has been reviewed, patient examined, no change in status, stable for surgery.  I have reviewed the patient's chart and labs.  Questions were answered to the patient's satisfaction.     [name]

## 2019-09-27 NOTE — Op Note (Signed)
[facility] Patient Name: Sean Haynes Procedure Date : 09/27/2019 MRN: [id number] Attending MD: [name] , MD Date of Birth: 03-29-1965 CSN: [id number] Age: 54 Admit Type: Inpatient Procedure:                Upper GI endoscopy Indications:              Iron deficiency anemia, Heme positive stool Providers:                [name], MD, Clyde Lundborg, RN, Theodora Blow, Technician Referring MD:             hospital team Medicines:                Propofol per Anesthesia, Monitored Anesthesia Care Complications:            No immediate complications. Estimated Blood Loss:     Estimated blood loss: none. Procedure:                Pre-Anesthesia Assessment:                           - Prior to the procedure, a History and Physical                            was performed, and patient medications and                            allergies were reviewed. The patient's tolerance of                            previous anesthesia was also reviewed. The risks                            and benefits of the procedure and the sedation                            options and risks were discussed with the patient.                            All questions were answered, and informed consent                            was obtained. Prior Anticoagulants: The patient has                            taken no previous anticoagulant or antiplatelet                            agents. ASA Grade Assessment: III - A patient with                            severe systemic disease. After reviewing the risks  and benefits, the patient was deemed in                            satisfactory condition to undergo the procedure.                           After obtaining informed consent, the endoscope was                            passed under direct vision. Throughout the                            procedure, the patient's blood  pressure, pulse, and                            oxygen saturations were monitored continuously. The                            GIF-H190 ([id number]) Olympus gastroscope was                            introduced through the mouth, and advanced to the                            second part of duodenum. The upper GI endoscopy was                            accomplished without difficulty. The patient                            tolerated the procedure well. Scope In: Scope Out: Findings:      The examined esophagus was normal.      The Z-line was regular and was found 42 cm from the incisors.      The entire examined stomach was normal.      The cardia and gastric fundus were normal on retroflexion.      The examined duodenum was normal. Impression:               - Normal esophagus.                           - Z-line regular, 42 cm from the incisors.                           - Normal stomach.                           - Normal examined duodenum.                           - No specimens collected. Recommendation:           - NPO.                           - Observe patient's clinical course.                           -  To visualize the small bowel, perform video                            capsule endoscopy. Procedure Code(s):        --- Professional ---                           [phone], Esophagogastroduodenoscopy, flexible,                            transoral; diagnostic, including collection of                            specimen(s) by brushing or washing, when performed                            (separate procedure) Diagnosis Code(s):        --- Professional ---                           D50.9, Iron deficiency anemia, unspecified                           R19.5, Other fecal abnormalities CPT copyright 2019 [employer]. All rights reserved. The codes documented in this report are preliminary and upon coder review may  be revised to meet current compliance  requirements. [name], MD 09/27/2019 3:27:50 PM This report has been signed electronically. Number of Addenda: 0

## 2019-09-27 NOTE — Transfer of Care (Signed)
Immediate Anesthesia Transfer of Care Note  Patient: Sean Haynes  Procedure(s) Performed: ESOPHAGOGASTRODUODENOSCOPY (EGD) WITH PROPOFOL (N/A )  Patient Location: Endoscopy Unit  Anesthesia Type:MAC  Level of Consciousness: drowsy and patient cooperative  Airway & Oxygen Therapy: Patient Spontanous Breathing and Patient connected to nasal cannula oxygen  Post-op Assessment: Report given to RN and Post -op Vital signs reviewed and stable  Post vital signs: Reviewed and stable  Last Vitals:  Vitals Value Taken Time  BP 153/91 09/27/19 1529  Temp    Pulse 75 09/27/19 1530  Resp 17 09/27/19 1530  SpO2 100 % 09/27/19 1530  Vitals shown include unvalidated device data.  Last Pain:  Vitals:   09/27/19 1428  TempSrc: Oral  PainSc: 0-No pain         Complications: No complications documented.

## 2019-09-27 NOTE — Progress Notes (Signed)
PROGRESS NOTE    Sean Haynes  IZT:[id number] DOB: 18-Jul-1965 DOA: 09/26/2019 PCP: [facility]    Brief Narrative:  54 year old gentleman with history of hypertension, hyperlipidemia, daily alcohol use sent by primary care physician with low hemoglobin.  Patient has been having some symptoms especially like to chew a lot of ice so went to be his PCP.  Hemoglobin was 5.7 on presentation. In the emergency room, stool occult blood positive.  Hemoglobin 5.7.  Ferritin 6.  2 units of transfusion given and admitted.   Assessment & Plan:   Principal Problem:   Symptomatic anemia Active Problems:   Hypertension   GI bleed  Severe symptomatic anemia, severe iron deficiency anemia suspected due to upper GI blood loss: Hemoglobin 5.7-2 units of red cells-6.6, recheck before further transfusion.  Will give IV iron before discharge. Likely chronic GI bleed, on PPI.  Will be seen by gastroenterology, will need upper GI endoscopy.  Reportedly normal colonoscopy 4 years ago.  Hypertension: Blood pressures are stable.  Home hypertensive medications on hold.  Alcohol use: Counseled to quit.  On multivitamins and CIWA protocol.  Denies any withdrawals.  Hypokalemia: We will replace and monitor levels.  Check magnesium with the morning labs.   DVT prophylaxis: SCDs Start: 09/26/19 2300   Code Status: Full code Family Communication: Wife and son at the bedside Disposition Plan: Status is: Observation  The patient will require care spanning > 2 midnights and should be moved to inpatient because: Ongoing diagnostic testing needed not appropriate for outpatient work up and Inpatient level of care appropriate due to severity of illness  Dispo: The patient is from: Home              Anticipated d/c is to: Home              Anticipated d/c date is: 2 days              Patient currently is not medically stable to d/c.  Patient admitted with significant symptomatic anemia.  He will still  need more blood transfusions and close monitoring of hemoglobin.  He will need inpatient endoscopy procedure.    Consultants:   Gastroenterology, [facility]  Procedures:   None  Antimicrobials:   None   Subjective: Patient seen and examined.  Family at the bedside.  Denies any complaints.  Was hungry.  Objective: Vitals:   09/26/19 2342 09/27/19 0317 09/27/19 0415 09/27/19 1006  BP: (!) 166/93 (!) 142/75 (!) 145/73 (!) 149/87  Pulse: 93 73 77 73  Resp: 20 18 19 18   Temp: 99 F (37.2 C) 98.4 F (36.9 C) 98.2 F (36.8 C) 98.3 F (36.8 C)  TempSrc: Oral  Oral Oral  SpO2: 100% 98% 98% 99%  Weight: 127 kg     Height: 6\' 4"  (1.93 m)       Intake/Output Summary (Last 24 hours) at 09/27/2019 1131 Last data filed at 09/27/2019 0900 Gross per 24 hour  Intake 630 ml  Output 450 ml  Net 180 ml   Filed Weights   09/26/19 1729 09/26/19 2342  Weight: 129.3 kg 127 kg    Examination:  General exam: Appears calm and comfortable , pale looking. Respiratory system: Clear to auscultation. Respiratory effort normal. Cardiovascular system: S1 & S2 heard, RRR. No JVD, murmurs, rubs, gallops or clicks. No pedal edema. Gastrointestinal system: Abdomen is nondistended, soft and nontender. Central nervous system: Alert and oriented. No focal neurological deficits. Extremities: Symmetric 5 x 5  power. Skin: No rashes, lesions or ulcers Psychiatry: Judgement and insight appear normal. Mood & affect appropriate.     Data Reviewed: I have personally reviewed following labs and imaging studies  CBC: Recent Labs  Lab 09/26/19 1738 09/27/19 0813  WBC 5.8 4.8  HGB 5.7* 6.6*  HCT 22.1* 24.0*  MCV 67.2* 69.4*  PLT 210 035   Basic Metabolic Panel: Recent Labs  Lab 09/26/19 1738 09/27/19 0813  NA 139 139  K 3.3* 3.4*  CL 104 106  CO2 24 24  GLUCOSE 104* 101*  BUN 13 11  CREATININE 1.13 1.06  CALCIUM 9.2 8.6*   GFR: Estimated Creatinine Clearance: 114.6 mL/min (by C-G formula  based on SCr of 1.06 mg/dL). Liver Function Tests: Recent Labs  Lab 09/26/19 1738  AST 73*  ALT 48*  ALKPHOS 88  BILITOT 0.7  PROT 7.1  ALBUMIN 3.8   No results for input(s): LIPASE, AMYLASE in the last 168 hours. No results for input(s): AMMONIA in the last 168 hours. Coagulation Profile: No results for input(s): INR, PROTIME in the last 168 hours. Cardiac Enzymes: No results for input(s): CKTOTAL, CKMB, CKMBINDEX, TROPONINI in the last 168 hours. BNP (last 3 results) No results for input(s): PROBNP in the last 8760 hours. HbA1C: No results for input(s): HGBA1C in the last 72 hours. CBG: No results for input(s): GLUCAP in the last 168 hours. Lipid Profile: No results for input(s): CHOL, HDL, LDLCALC, TRIG, CHOLHDL, LDLDIRECT in the last 72 hours. Thyroid Function Tests: No results for input(s): TSH, T4TOTAL, FREET4, T3FREE, THYROIDAB in the last 72 hours. Anemia Panel: Recent Labs    09/26/19 2030  VITAMINB12 319  FOLATE 8.7  FERRITIN 6*  TIBC 529*  IRON 15*  RETICCTPCT 1.7   Sepsis Labs: No results for input(s): PROCALCITON, LATICACIDVEN in the last 168 hours.  Recent Results (from the past 240 hour(s))  SARS Coronavirus 2 by RT PCR (hospital order, performed in [facility] hospital lab) Nasopharyngeal Nasopharyngeal Swab     Status: None   Collection Time: 09/26/19  8:01 PM   Specimen: Nasopharyngeal Swab  Result Value Ref Range Status   SARS Coronavirus 2 NEGATIVE NEGATIVE Final    Comment: (NOTE) SARS-CoV-2 target nucleic acids are NOT DETECTED.  The SARS-CoV-2 RNA is generally detectable in upper and lower respiratory specimens during the acute phase of infection. The lowest concentration of SARS-CoV-2 viral copies this assay can detect is 250 copies / mL. A negative result does not preclude SARS-CoV-2 infection and should not be used as the sole basis for treatment or other patient management decisions.  A negative result may occur with improper  specimen collection / handling, submission of specimen other than nasopharyngeal swab, presence of viral mutation(s) within the areas targeted by this assay, and inadequate number of viral copies (<250 copies / mL). A negative result must be combined with clinical observations, patient history, and epidemiological information.  Fact Sheet for Patients:   StrictlyIdeas.no  Fact Sheet for Healthcare Providers: BankingDealers.co.za  This test is not yet approved or  cleared by the [place] FDA and has been authorized for detection and/or diagnosis of SARS-CoV-2 by FDA under an Emergency Use Authorization (EUA).  This EUA will remain in effect (meaning this test can be used) for the duration of the COVID-19 declaration under Section 564(b)(1) of the Act, 21 U.S.C. section 360bbb-3(b)(1), unless the authorization is terminated or revoked sooner.  Performed at [facility], [place] [address] [place], [place] [address]  Radiology Studies: No results found.      Scheduled Meds: . folic acid  1 mg Oral Daily  . melatonin  3 mg Oral QHS  . multivitamin with minerals  1 tablet Oral Daily  . pantoprazole (PROTONIX) IV  40 mg Intravenous Q12H  . potassium chloride  40 mEq Oral BID  . thiamine  100 mg Oral Daily   Or  . thiamine  100 mg Intravenous Daily   Continuous Infusions:   LOS: 0 days    Time spent: 30 minutes    Barb Merino, MD [facility] Pager [phone]

## 2019-09-27 NOTE — Anesthesia Preprocedure Evaluation (Addendum)
Anesthesia Evaluation  Patient identified by MRN, date of birth, ID band Patient awake    Reviewed: Allergy & Precautions, NPO status , Patient's Chart, lab work & pertinent test results  Airway Mallampati: II  TM Distance: >3 FB Neck ROM: Full    Dental no notable dental hx. (+) Teeth Intact, Dental Advisory Given   Pulmonary former smoker,    Pulmonary exam normal breath sounds clear to auscultation       Cardiovascular hypertension, Pt. on medications Normal cardiovascular exam Rhythm:Regular Rate:Normal     Neuro/Psych negative neurological ROS     GI/Hepatic negative GI ROS, (+)     substance abuse  alcohol use,   Endo/Other  negative endocrine ROS  Renal/GU negative Renal ROS     Musculoskeletal negative musculoskeletal ROS (+)   Abdominal   Peds  Hematology  (+) anemia , Lab Results      Component                Value               Date                      WBC                      4.8                 09/27/2019                HGB                      7.1 (L)             09/27/2019                HCT                      26.0 (L)            09/27/2019                MCV                      69.4 (L)            09/27/2019                PLT                      181                 09/27/2019              Anesthesia Other Findings   Reproductive/Obstetrics negative OB ROS                           Anesthesia Physical Anesthesia Plan  ASA: III  Anesthesia Plan: MAC   Post-op Pain Management:    Induction: Intravenous  PONV Risk Score and Plan: Treatment may vary due to age or medical condition, Ondansetron and Dexamethasone  Airway Management Planned: Nasal Cannula and Natural Airway  Additional Equipment:   Intra-op Plan:   Post-operative Plan:   Informed Consent: I have reviewed the patients History and Physical, chart, labs and discussed the procedure including  the risks, benefits and alternatives for the proposed anesthesia with the patient or authorized representative  who has indicated his/her understanding and acceptance.     Dental advisory given  Plan Discussed with:   Anesthesia Plan Comments:         Anesthesia Quick Evaluation

## 2019-09-27 NOTE — H&P (View-Only) (Signed)
Referring Provider: Dr. Sloan Leiter Primary Care Physician:  [facility] Primary Gastroenterologist:  Dr. Oletta Lamas  Reason for Consultation:  Anemia; Heme positive stool  HPI: Sean Haynes is a 54 y.o. male with pica for the past month and mild weakness without rectal bleeding, hematemesis, melena, N/V, abdominal pain, weight loss or change in bowel habits. Colonoscopyin 02/2015 showed sigmoid diverticulosis. Several alcoholic drinks per day. Aspirin 81 mg/day and otherwise denies NSAIDs. Hgb 6.6. Heme positive stool. Wife at bedside.  Past Medical History:  Diagnosis Date  . Hyperlipidemia   . Hypertension     Past Surgical History:  Procedure Laterality Date  . MEDIAL COLLATERAL LIGAMENT AND LATERAL COLLATERAL LIGAMENT REPAIR, KNEE  2004   right    Prior to Admission medications   Medication Sig Start Date End Date Taking? Authorizing Provider  aspirin EC 81 MG tablet Take 81 mg by mouth daily. Swallow whole.   Yes [provider]  hydrochlorothiazide (HYDRODIURIL) 25 MG tablet Take 1 tablet (25 mg total) by mouth daily. Patient taking differently: Take 12.5 mg by mouth daily.  11/15/12  Yes Dunn, Areta [facility]er, PA-C  losartan (COZAAR) 100 MG tablet Take 100 mg by mouth daily.  08/01/19  Yes [provider]  melatonin 3 MG TABS tablet Take 3 mg by mouth at bedtime.   Yes [provider]  pantoprazole (PROTONIX) 40 MG tablet Take 40 mg by mouth daily. 08/01/19  Yes [provider]  traZODone (DESYREL) 100 MG tablet Take 100 mg by mouth at bedtime as needed for sleep (insomnia).   Yes [provider]  chlorthalidone (HYGROTON) 25 MG tablet Take 12.5 mg by mouth every morning. Patient not taking: Reported on 09/26/2019 08/03/19   [provider]  traMADol (ULTRAM) 50 MG tablet Take 1 tablet (50 mg total) by mouth every 6 (six) hours as needed. Patient not taking: Reported on 09/26/2019 05/19/18   Vanessa Kick, MD    Scheduled  Meds: . folic acid  1 mg Oral Daily  . melatonin  3 mg Oral QHS  . multivitamin with minerals  1 tablet Oral Daily  . pantoprazole (PROTONIX) IV  40 mg Intravenous Q12H  . potassium chloride  40 mEq Oral BID  . thiamine  100 mg Oral Daily   Or  . thiamine  100 mg Intravenous Daily   Continuous Infusions: PRN Meds:.hydrALAZINE, LORazepam **OR** LORazepam, ondansetron **OR** ondansetron (ZOFRAN) IV, traZODone  Allergies as of 09/26/2019  . (No Known Allergies)    Family History  Problem Relation Age of Onset  . Diabetes Father   . Hyperlipidemia Father   . Hypertension Father   . Stroke Father   . Anemia Mother   . Arrhythmia Mother   . Diabetes Mother   . Hyperlipidemia Mother   . Hypertension Mother   . Arrhythmia Sister   . Arrhythmia Brother   . Hypertension Sister   . Hypertension Sister   . Hypertension Brother   . Hypertension Brother     Social History   Socioeconomic History  . Marital status: Married    Spouse name: Not on file  . Number of children: 2  . Years of education: Not on file  . Highest education level: Not on file  Occupational History    Employer: [employer]  Tobacco Use  . Smoking status: Former Smoker    Packs/day: 2.00    Years: 34.00    Pack years: 68.00    Types: Cigarettes  . Smokeless tobacco:  Never Used  Substance and Sexual Activity  . Alcohol use: Yes    Comment: 6 pack of beer and half a fifth per week  . Drug use: No  . Sexual activity: Not on file  Other Topics Concern  . Not on file  Social History Narrative  . Not on file   Social Determinants of Health   Financial Resource Strain:   . Difficulty of Paying Living Expenses:   Food Insecurity:   . Worried About Charity fundraiser in the Last Year:   . Arboriculturist in the Last Year:   Transportation Needs:   . Film/video editor (Medical):   Marland Kitchen Lack of Transportation (Non-Medical):   Physical Activity:   . Days of Exercise per Week:   . Minutes of  Exercise per Session:   Stress:   . Feeling of Stress :   Social Connections:   . Frequency of Communication with Friends and Family:   . Frequency of Social Gatherings with Friends and Family:   . Attends Religious Services:   . Active Member of Clubs or Organizations:   . Attends Archivist Meetings:   Marland Kitchen Marital Status:   Intimate Partner Violence:   . Fear of Current or Ex-Partner:   . Emotionally Abused:   Marland Kitchen Physically Abused:   . Sexually Abused:     Review of Systems: All negative except as stated above in HPI.  Physical Exam: Vital signs: Vitals:   09/27/19 0415 09/27/19 1006  BP: (!) 145/73 (!) 149/87  Pulse: 77 73  Resp: 19 18  Temp: 98.2 F (36.8 C) 98.3 F (36.8 C)  SpO2: 98% 99%   Last BM Date: 09/26/19 General:   Alert,  Well-developed, well-nourished, pleasant and cooperative in NAD Head: normocephalic, atraumatic Eyes: anicteric sclera ENT: oropharynx clear Neck: supple, nontender Lungs:  Clear throughout to auscultation.   No wheezes, crackles, or rhonchi. No acute distress. Heart:  Regular rate and rhythm; no murmurs, clicks, rubs,  or gallops. Abdomen: soft, nontender, nondistended, +BS  Rectal:  Deferred Ext: no edema  GI:  Lab Results: Recent Labs    09/26/19 1738 09/27/19 0813  WBC 5.8 4.8  HGB 5.7* 6.6*  HCT 22.1* 24.0*  PLT 210 181   BMET Recent Labs    09/26/19 1738 09/27/19 0813  NA 139 139  K 3.3* 3.4*  CL 104 106  CO2 24 24  GLUCOSE 104* 101*  BUN 13 11  CREATININE 1.13 1.06  CALCIUM 9.2 8.6*   LFT Recent Labs    09/26/19 1738  PROT 7.1  ALBUMIN 3.8  AST 73*  ALT 48*  ALKPHOS 88  BILITOT 0.7   PT/INR No results for input(s): LABPROT, INR in the last 72 hours.   Studies/Results: No results found.  Impression/Plan: Symptomatic anemia with weakness and pica and heme positive stool without overt bleeding. EGD today to evaluate for peptic ulcer disease and if EGD is unrevealing then will do a small  bowel capsule endoscopy. I do not think an updated colonoscopy is needed but if EGD and capsule are unrevealing, then that will need to be considered. NPO for EGD today.    LOS: 0 days   [name]  09/27/2019, 11:37 AM  Questions please call [phone]

## 2019-09-27 NOTE — Brief Op Note (Signed)
Normal EGD. Capsule endoscopy to be placed today and will review study tomorrow when complete. Diet recs per capsule endoscopy recs.

## 2019-09-28 ENCOUNTER — Encounter (HOSPITAL_COMMUNITY): Payer: Self-pay

## 2019-09-28 ENCOUNTER — Other Ambulatory Visit: Payer: Self-pay | Admitting: Internal Medicine

## 2019-09-28 LAB — COMPREHENSIVE METABOLIC PANEL
ALT: 73 U/L — ABNORMAL HIGH (ref 0–44)
AST: 99 U/L — ABNORMAL HIGH (ref 15–41)
Albumin: 3.7 g/dL (ref 3.5–5.0)
Alkaline Phosphatase: 67 U/L (ref 38–126)
Anion gap: 7 (ref 5–15)
BUN: 10 mg/dL (ref 6–20)
CO2: 24 mmol/L (ref 22–32)
Calcium: 9.2 mg/dL (ref 8.9–10.3)
Chloride: 107 mmol/L (ref 98–111)
Creatinine, Ser: 1.17 mg/dL (ref 0.61–1.24)
GFR calc Af Amer: >60 mL/min (ref >60)
GFR calc non Af Amer: >60 mL/min (ref >60)
Glucose, Bld: 102 mg/dL — ABNORMAL HIGH (ref 70–99)
Potassium: 3.8 mmol/L (ref 3.5–5.1)
Sodium: 138 mmol/L (ref 135–145)
Total Bilirubin: 1.1 mg/dL (ref 0.3–1.2)
Total Protein: 7 g/dL (ref 6.5–8.1)

## 2019-09-28 LAB — CBC WITH DIFFERENTIAL/PLATELET
Abs Immature Granulocytes: 0.02 10*3/uL (ref 0.00–0.07)
Basophils Absolute: 0.1 10*3/uL (ref 0.0–0.1)
Basophils Relative: 1 %
Eosinophils Absolute: 0.2 10*3/uL (ref 0.0–0.5)
Eosinophils Relative: 3 %
HCT: 27.2 % — ABNORMAL LOW (ref 39.0–52.0)
Hemoglobin: 7.5 g/dL — ABNORMAL LOW (ref 13.0–17.0)
Immature Granulocytes: 0 %
Lymphocytes Relative: 25 %
Lymphs Abs: 1.4 10*3/uL (ref 0.7–4.0)
MCH: 19.5 pg — ABNORMAL LOW (ref 26.0–34.0)
MCHC: 27.6 g/dL — ABNORMAL LOW (ref 30.0–36.0)
MCV: 70.8 fL — ABNORMAL LOW (ref 80.0–100.0)
Monocytes Absolute: 0.6 10*3/uL (ref 0.1–1.0)
Monocytes Relative: 11 %
Neutro Abs: 3.2 10*3/uL (ref 1.7–7.7)
Neutrophils Relative %: 60 %
Platelets: 216 10*3/uL (ref 150–400)
RBC: 3.84 MIL/uL — ABNORMAL LOW (ref 4.22–5.81)
RDW: 20.2 % — ABNORMAL HIGH (ref 11.5–15.5)
WBC: 5.4 10*3/uL (ref 4.0–10.5)
nRBC: 0 % (ref 0.0–0.2)

## 2019-09-28 LAB — PHOSPHORUS: Phosphorus: 3.6 mg/dL (ref 2.5–4.6)

## 2019-09-28 LAB — MAGNESIUM: Magnesium: 2 mg/dL (ref 1.7–2.4)

## 2019-09-28 MED ORDER — FERROUS SULFATE 325 (65 FE) MG PO TBEC
325.0000 mg | DELAYED_RELEASE_TABLET | Freq: Two times a day (BID) | ORAL | 3 refills | Status: DC
Start: 2019-09-28 — End: 2019-09-28

## 2019-09-28 MED ORDER — FERROUS SULFATE 325 (65 FE) MG PO TBEC: 325.0000 mg | DELAYED_RELEASE_TABLET | Freq: Two times a day (BID) | ORAL | 3 refills | Status: DC

## 2019-09-28 NOTE — Progress Notes (Signed)
[facility] Progress Note  Sean Haynes 54 y.o. 10-Dec-1965   Subjective: Feels good. Tolerating diet. Denies abdominal pain. Wife in room.  Objective: Vital signs: Vitals:   09/28/19 0746 09/28/19 0958  BP: (!) 162/85 (!) 147/79  Pulse: 76 81  Resp: 16 18  Temp: 98.6 F (37 C) 98.2 F (36.8 C)  SpO2: 98% 99%    Physical Exam: Gen: alert, no acute distress, well-nourished HEENT: anicteric sclera  Lab Results: Recent Labs    09/27/19 0813 09/28/19 0353  NA 139 138  K 3.4* 3.8  CL 106 107  CO2 24 24  GLUCOSE 101* 102*  BUN 11 10  CREATININE 1.06 1.17  CALCIUM 8.6* 9.2  MG  --  2.0  PHOS  --  3.6   Recent Labs    09/26/19 1738 09/28/19 0353  AST 73* 99*  ALT 48* 73*  ALKPHOS 88 67  BILITOT 0.7 1.1  PROT 7.1 7.0  ALBUMIN 3.8 3.7   Recent Labs    09/27/19 0813 09/27/19 0813 09/27/19 1127 09/28/19 0353  WBC 4.8  --   --  5.4  NEUTROABS  --   --   --  3.2  HGB 6.6*   < > 7.1* 7.5*  HCT 24.0*   < > 26.0* 27.2*  MCV 69.4*  --   --  70.8*  PLT 181  --   --  216   < > = values in this interval not displayed.      Assessment/Plan: Obscure GI bleeding with symptomatic anemia - capsule endoscopy unremarkable with only a few small nonbleeding arteriovenous malformations (AVMs) that I doubt are the source of his severe anemia. Heme positive stool could be from hemorrhoids. Would recommend an outpatient hematology evaluation and if their work up is unrevealing may also need to do an updated colonoscopy. Ok to go home today. Needs CBC checked this week. His primary doctor is at the [place]. D/W Dr. Sloan Leiter.   Sean Haynes 09/28/2019, 1:07 PM  Questions please call [phone] ID: Sean Haynes, male   DOB: 1965-07-24, 54 y.o.   MRN: [id number]

## 2019-09-28 NOTE — Progress Notes (Signed)
Patient discharged to home. Verbalizes understanding of all discharge instructions including discharge medications and follow up MD visits. Spouse present for discharge instructions. No questions at this time. AVS provided to patient.

## 2019-09-28 NOTE — Discharge Summary (Signed)
Physician Discharge Summary  Sean Haynes IFO:[id number] DOB: Apr 26, 1965 DOA: 09/26/2019  PCP: [facility]  Admit date: 09/26/2019 Discharge date: 09/28/2019  Admitted From: Home Disposition: Home  Recommendations for Outpatient Follow-up:  1. Follow up with PCP in 1-2 weeks 2. Please obtain CBC in 1 week 3. GI office to schedule follow-up.  We will send referral to hematology office for follow-up.  Discharge Condition: Stable CODE STATUS: Full code Diet recommendation: Low-salt diet, no alcohol   Discharge summary: 54 year old gentleman with history of hypertension, hyperlipidemia, daily alcohol use sent by primary care physician with low hemoglobin.  Patient was having symptoms of iron deficiency anemia like he was chewing a lot of ice so went to see his primary care physician.  Hemoglobin was 5.7, stool occult blood positive, ferritin was 6.  He was given 2 units of transfusion with appropriate response.  Admitted to hospital with symptomatic anemia.  Severe symptomatic anemia, probably chronic ongoing issues including low intermittent GI bleed: Colonoscopy 5 years ago normal EGD 7/9 with no evidence of bleeding Capsule endoscopy 7/9 with no evidence of bleeding, minor AVMs 2 units of PRBC on 7/9 1 unit of ferehem 550 on 7/9 Hemoglobin is 7.5 today.  Symptoms improved. No evidence of active GI bleeding or recent GI bleeding.  Ferritin 6 indicating severe iron deficiency anemia. GI recommended discharge on iron supplements, hematology follow-up.  GI office to follow-up patient for further testing and management.  Complete abstinence from alcohol advised.  Discharge Diagnoses:  Principal Problem:   Symptomatic anemia Active Problems:   Hypertension   GI bleed    Discharge Instructions  Discharge Instructions    Ambulatory referral to Hematology   Complete by: As directed    Iron deficient anemia   Diet - low sodium heart healthy   Complete by: As directed     Increase activity slowly   Complete by: As directed      Allergies as of 09/28/2019   No Known Allergies     Medication List    STOP taking these medications   aspirin EC 81 MG tablet   chlorthalidone 25 MG tablet Commonly known as: HYGROTON   traMADol 50 MG tablet Commonly known as: ULTRAM     TAKE these medications   ferrous sulfate 325 (65 FE) MG EC tablet Take 1 tablet (325 mg total) by mouth 2 (two) times daily.   hydrochlorothiazide 25 MG tablet Commonly known as: HYDRODIURIL Take 1 tablet (25 mg total) by mouth daily. What changed: how much to take   losartan 100 MG tablet Commonly known as: COZAAR Take 100 mg by mouth daily.   melatonin 3 MG Tabs tablet Take 3 mg by mouth at bedtime.   pantoprazole 40 MG tablet Commonly known as: PROTONIX Take 40 mg by mouth daily.   traZODone 100 MG tablet Commonly known as: DESYREL Take 100 mg by mouth at bedtime as needed for sleep (insomnia).       No Known Allergies  Consultations:  Gastroenterology   Procedures/Studies: No results found. (Echo, Carotid, EGD, Colonoscopy, ERCP)    Subjective: Patient seen and examined.  Wife at bedside.  Denies any complaints.  Eager to go home.   Discharge Exam: Vitals:   09/28/19 0746 09/28/19 0958  BP: (!) 162/85 (!) 147/79  Pulse: 76 81  Resp: 16 18  Temp: 98.6 F (37 C) 98.2 F (36.8 C)  SpO2: 98% 99%   Vitals:   09/28/19 0103 09/28/19 0539 09/28/19 0746 09/28/19  0958  BP: (!) 144/70 (!) 161/79 (!) 162/85 (!) 147/79  Pulse: 82 78 76 81  Resp: 19 20 16 18   Temp: 98 F (36.7 C) 98.4 F (36.9 C) 98.6 F (37 C) 98.2 F (36.8 C)  TempSrc: Oral Oral  Oral  SpO2: 98% 99% 98% 99%  Weight:      Height:        General: Pt is alert, awake, not in acute distress Cardiovascular: RRR, S1/S2 +, no rubs, no gallops Respiratory: CTA bilaterally, no wheezing, no rhonchi Abdominal: Soft, NT, ND, bowel sounds + Extremities: no edema, no cyanosis    The  results of significant diagnostics from this hospitalization (including imaging, microbiology, ancillary and laboratory) are listed below for reference.     Microbiology: Recent Results (from the past 240 hour(s))  SARS Coronavirus 2 by RT PCR (hospital order, performed in [facility] hospital lab) Nasopharyngeal Nasopharyngeal Swab     Status: None   Collection Time: 09/26/19  8:01 PM   Specimen: Nasopharyngeal Swab  Result Value Ref Range Status   SARS Coronavirus 2 NEGATIVE NEGATIVE Final    Comment: (NOTE) SARS-CoV-2 target nucleic acids are NOT DETECTED.  The SARS-CoV-2 RNA is generally detectable in upper and lower respiratory specimens during the acute phase of infection. The lowest concentration of SARS-CoV-2 viral copies this assay can detect is 250 copies / mL. A negative result does not preclude SARS-CoV-2 infection and should not be used as the sole basis for treatment or other patient management decisions.  A negative result may occur with improper specimen collection / handling, submission of specimen other than nasopharyngeal swab, presence of viral mutation(s) within the areas targeted by this assay, and inadequate number of viral copies (<250 copies / mL). A negative result must be combined with clinical observations, patient history, and epidemiological information.  Fact Sheet for Patients:   StrictlyIdeas.no  Fact Sheet for Healthcare Providers: BankingDealers.co.za  This test is not yet approved or  cleared by the [place] FDA and has been authorized for detection and/or diagnosis of SARS-CoV-2 by FDA under an Emergency Use Authorization (EUA).  This EUA will remain in effect (meaning this test can be used) for the duration of the COVID-19 declaration under Section 564(b)(1) of the Act, 21 U.S.C. section 360bbb-3(b)(1), unless the authorization is terminated or revoked sooner.  Performed at [facility], [place] [address], [place],  [address]      Labs: BNP (last 3 results) No results for input(s): BNP in the last 8760 hours. Basic Metabolic Panel: Recent Labs  Lab 09/26/19 1738 09/27/19 0813 09/28/19 0353  NA 139 139 138  K 3.3* 3.4* 3.8  CL 104 106 107  CO2 24 24 24   GLUCOSE 104* 101* 102*  BUN 13 11 10   CREATININE 1.13 1.06 1.17  CALCIUM 9.2 8.6* 9.2  MG  --   --  2.0  PHOS  --   --  3.6   Liver Function Tests: Recent Labs  Lab 09/26/19 1738 09/28/19 0353  AST 73* 99*  ALT 48* 73*  ALKPHOS 88 67  BILITOT 0.7 1.1  PROT 7.1 7.0  ALBUMIN 3.8 3.7   No results for input(s): LIPASE, AMYLASE in the last 168 hours. No results for input(s): AMMONIA in the last 168 hours. CBC: Recent Labs  Lab 09/26/19 1738 09/27/19 0813 09/27/19 1127 09/28/19 0353  WBC 5.8 4.8  --  5.4  NEUTROABS  --   --   --  3.2  HGB 5.7* 6.6* 7.1* 7.5*  HCT 22.1* 24.0* 26.0* 27.2*  MCV 67.2* 69.4*  --  70.8*  PLT 210 181  --  216   Cardiac Enzymes: No results for input(s): CKTOTAL, CKMB, CKMBINDEX, TROPONINI in the last 168 hours. BNP: Invalid input(s): POCBNP CBG: No results for input(s): GLUCAP in the last 168 hours. D-Dimer No results for input(s): DDIMER in the last 72 hours. Hgb A1c No results for input(s): HGBA1C in the last 72 hours. Lipid Profile No results for input(s): CHOL, HDL, LDLCALC, TRIG, CHOLHDL, LDLDIRECT in the last 72 hours. Thyroid function studies No results for input(s): TSH, T4TOTAL, T3FREE, THYROIDAB in the last 72 hours.  Invalid input(s): FREET3 Anemia work up Recent Labs    09/26/19 2030  VITAMINB12 319  FOLATE 8.7  FERRITIN 6*  TIBC 529*  IRON 15*  RETICCTPCT 1.7   Urinalysis No results found for: COLORURINE, APPEARANCEUR, LABSPEC, PHURINE, GLUCOSEU, HGBUR, BILIRUBINUR, KETONESUR, PROTEINUR, UROBILINOGEN, NITRITE, LEUKOCYTESUR Sepsis Labs Invalid input(s): PROCALCITONIN,  WBC,  LACTICIDVEN Microbiology Recent Results (from the  past 240 hour(s))  SARS Coronavirus 2 by RT PCR (hospital order, performed in [facility] hospital lab) Nasopharyngeal Nasopharyngeal Swab     Status: None   Collection Time: 09/26/19  8:01 PM   Specimen: Nasopharyngeal Swab  Result Value Ref Range Status   SARS Coronavirus 2 NEGATIVE NEGATIVE Final    Comment: (NOTE) SARS-CoV-2 target nucleic acids are NOT DETECTED.  The SARS-CoV-2 RNA is generally detectable in upper and lower respiratory specimens during the acute phase of infection. The lowest concentration of SARS-CoV-2 viral copies this assay can detect is 250 copies / mL. A negative result does not preclude SARS-CoV-2 infection and should not be used as the sole basis for treatment or other patient management decisions.  A negative result may occur with improper specimen collection / handling, submission of specimen other than nasopharyngeal swab, presence of viral mutation(s) within the areas targeted by this assay, and inadequate number of viral copies (<250 copies / mL). A negative result must be combined with clinical observations, patient history, and epidemiological information.  Fact Sheet for Patients:   StrictlyIdeas.no  Fact Sheet for Healthcare Providers: BankingDealers.co.za  This test is not yet approved or  cleared by the [place] FDA and has been authorized for detection and/or diagnosis of SARS-CoV-2 by FDA under an Emergency Use Authorization (EUA).  This EUA will remain in effect (meaning this test can be used) for the duration of the COVID-19 declaration under Section 564(b)(1) of the Act, 21 U.S.C. section 360bbb-3(b)(1), unless the authorization is terminated or revoked sooner.  Performed at [facility], [place] [address] [place], [place] [address]      Time coordinating discharge:  32 minutes  SIGNED:   Barb Merino, MD  [facility] 09/28/2019, 1:41 PM

## 2019-09-29 ENCOUNTER — Encounter (HOSPITAL_COMMUNITY): Payer: Self-pay | Admitting: Gastroenterology

## 2019-09-29 NOTE — Anesthesia Postprocedure Evaluation (Signed)
Anesthesia Post Note  Patient: Sean Haynes  Procedure(s) Performed: ESOPHAGOGASTRODUODENOSCOPY (EGD) WITH PROPOFOL (N/A ) GIVENS CAPSULE STUDY (N/A )     Patient location during evaluation: PACU Anesthesia Type: MAC Level of consciousness: awake and alert Pain management: pain level controlled Vital Signs Assessment: post-procedure vital signs reviewed and stable Respiratory status: spontaneous breathing, nonlabored ventilation, respiratory function stable and patient connected to nasal cannula oxygen Cardiovascular status: stable and blood pressure returned to baseline Postop Assessment: no apparent nausea or vomiting Anesthetic complications: no   No complications documented.  Last Vitals:  Vitals:   09/28/19 0746 09/28/19 0958  BP: (!) 162/85 (!) 147/79  Pulse: 76 81  Resp: 16 18  Temp: 37 C 36.8 C  SpO2: 98% 99%    Last Pain:  Vitals:   09/28/19 0958  TempSrc: Oral  PainSc:                  Barnet Glasgow

## 2019-10-14 ENCOUNTER — Encounter: Payer: Self-pay | Admitting: General Surgery

## 2019-10-14 ENCOUNTER — Encounter: Payer: Self-pay | Admitting: Nurse Practitioner

## 2019-10-15 ENCOUNTER — Ambulatory Visit: Payer: No Typology Code available for payment source | Admitting: Gastroenterology

## 2019-10-21 ENCOUNTER — Encounter: Payer: Self-pay | Admitting: Gastroenterology

## 2019-10-24 ENCOUNTER — Ambulatory Visit (INDEPENDENT_AMBULATORY_CARE_PROVIDER_SITE_OTHER): Payer: No Typology Code available for payment source | Admitting: Nurse Practitioner

## 2019-10-24 ENCOUNTER — Encounter: Payer: Self-pay | Admitting: Nurse Practitioner

## 2019-10-24 VITALS — BP 174/92 | HR 88 | Ht 76.0 in | Wt 279.8 lb

## 2019-10-24 DIAGNOSIS — D509 Iron deficiency anemia, unspecified: Secondary | ICD-10-CM | POA: Diagnosis not present

## 2019-10-24 NOTE — Progress Notes (Signed)
10/25/2019 Sean Haynes [id number] 12-Jan-1966   CHIEF COMPLAINT: schedule a colonoscopy   HISTORY OF PRESENT ILLNESS:  Sean Haynes is a 54 year old male with a past medical history of agoraphobia, posttraumatic stress disorder, insomnia, hypertension, hyperlipidemia and recently diagnosed with iron deficiency anemia. He presents to our office today as [facility] Dr[facility] Jeani Hawking Doe for further evaluation for IDA and to schedule a colonoscopy. He started chewing ice 2 months ago and a family member informed him that was a sign of anemia. He also felt fatigued for the past 1 to 2 months. He contacted his PCP and laboratory studies were done which showed profound anemia. He was sent directly to [facility] ED on 09/26/2019 for further evaluation. Labs in the ED showed a Hg level of 6.6. FOBT +. AST 73. ALT 48. He was transfused with 2 units of PRBCs. Post transfusion Hg 7.1 -> 7.5. No CP or SOB. No upper or lower abdominal pain. No hematochezia or melena. On ASA 79m daily. History of GERD on Protonix 410mdaily for more than 5 years. No prior history of ulcers or GI bleed. An EGD by Dr. ScMichail Sermon/11/2019 was normal, no evidence of recent or active bleeding. A small bowel capsule endoscopy showed minor small bowel AVMs without evidence of active bleeding. He had a colonoscopy in 2016 which showed sigmoid diverticulosis. A repeat colonoscopy was not done during his hospital admission. He received Feraheme IV on 7/9. He was discharged home 7/10 on Ferrous Sulfate 32566mnce daily with the instructions to schedule a hematology and GI follow up.  He presents today for further GI evaluation. He reports feeling well. His fatigue has improved. He is passing a normal formed stool once daily, stools are darker brown since starting Ferrous Sulfate. No rectal bleeding or melena. No upper or lower abdominal pain. No dysphagia or heartburn. He remains on Protonix 63m10mily. He no longer has  the need to chew on ice. No fever, sweats or chills. No weight loss. He was [facility]. He stated his Hg level was up to 10.5. He is scheduled for a second Feraheme infusion next week. ASA was discontinued during his hospital admission. He denies NSAID use, however, [facility] taking Meloxicam 15mg58msince 03/2019. He drinks liquor 2 to 3 (1 to 2 oz) glasses and 1 or 2 beers every evening, no alcohol for the past 4 weeks. No family history of gastric or colon cancer. No family history of liver disease. No other complaints today.   CBC Latest Ref Rng & Units 09/28/2019 09/27/2019 09/27/2019  WBC 4.0 - 10.5 K/uL 5.4 - 4.8  Hemoglobin 13.0 - 17.0 g/dL 7.5(L) 7.1(L) 6.6(LL)  Hematocrit 39 - 52 % 27.2(L) 26.0(L) 24.0(L)  Platelets 150 - 400 K/uL 216 - 181     CMP Latest Ref Rng & Units 09/28/2019 09/27/2019 09/26/2019  Glucose 70 - 99 mg/dL 102(H) 101(H) 104(H)  BUN 6 - 20 mg/dL _0 Creatinine 0.61 - 1.24 mg/dL 1.17 1.06 1.13  Sodium 135 - 145 mmol/L 138 139 139  Potassium 3.5 - 5.1 mmol/L 3.8 3.4(L) 3.3(L)  Chloride 98 - 111 mmol/L 107 106 104  CO2 22 - 32 mmol/L _1 Calcium 8.9 - 10.3 mg/dL 9.2 8.6(L) 9.2  Total Protein 6.5 - 8.1 g/dL 7.0 - 7.1  Total Bilirubin 0.3 - 1.2  mg/dL 1.1 - 0.7  Alkaline Phos 38 - 126 U/L 67 - 88  AST 15 - 41 U/L 99(H) - 73(H)  ALT 0 - 44 U/L 73(H) - 48(H)    EGD 09/27/2019: Normal esophagus. - Z-line regular, 42 cm from the incisors. - Normal stomach. - Normal examined duodenum. - No specimens collected.  Small bowel capsule endoscopy 09/27/2019: Multiple small bowel AMVs that are unlikely to be the source of anemia  Colonoscopy by Dr. Oletta Lamas 03/03/2015: Sigmoid diverticulosis Repeat colonoscopy 5 years   Past Medical History:  Diagnosis Date  . Agoraphobia with panic disorder    Per [facility] referral  . Allergic rhinitis    per [facility] referral  . Esophageal reflux    per [facility]  referral  . Hyperlipidemia   . Hypertension   . Impotence of organic origin    per [facility] referral  . Insomnia    per [facility] referral  . Iron deficiency anemia   . Post traumatic stress disorder    per [facility] referral   Past Surgical History:  Procedure Laterality Date  . ESOPHAGOGASTRODUODENOSCOPY (EGD) WITH PROPOFOL N/A 09/27/2019   Procedure: ESOPHAGOGASTRODUODENOSCOPY (EGD) WITH PROPOFOL;  Surgeon: Wilford Corner, MD;  Location: [facility];  Service: Endoscopy;  Laterality: N/A;  . GIVENS CAPSULE STUDY N/A 09/27/2019   Procedure: GIVENS CAPSULE STUDY;  Surgeon: Wilford Corner, MD;  Location: [facility];  Service: Endoscopy;  Laterality: N/A;  . MEDIAL COLLATERAL LIGAMENT AND LATERAL COLLATERAL LIGAMENT REPAIR, KNEE  2004   right    Social history: Married. [employer], retired. Works at [employer]. He smoked 1 - 2 ppd x 40 years, quit 2 1/2 years ago. He drinks liquor 2 to 3 (1 to 2 oz) glasses and 1 or 2 beers every evening, no alcohol for the past 4 weeks. Remote cocaine and marijuana use  25 to 30 years ago.   Family history: Mother age 74 with history of anemia and diabetes. Father died age 36 with diabetes, HTN, stroke and hyperlipidemia.  No Known Allergies    Outpatient Encounter Medications as of 10/24/2019  Medication Sig  . amLODipine (NORVASC) 5 MG tablet Take 5 mg by mouth daily.  . chlorthalidone (HYGROTON) 25 MG tablet Take 12.5 mg by mouth daily. Take half a tablet by mouth daily   . ferrous sulfate 325 (65 FE) MG EC tablet Take 1 tablet (325 mg total) by mouth 2 (two) times daily.  . hydrochlorothiazide (HYDRODIURIL) 25 MG tablet Take 1 tablet (25 mg total) by mouth daily. (Patient taking differently: Take 12.5 mg by mouth daily. )  . losartan (COZAAR) 100 MG tablet Take 100 mg by mouth daily.   . Multiple Vitamin (MULTIVITAMIN) tablet Take 1 tablet by mouth daily.  . pantoprazole (PROTONIX) 40 MG tablet Take 40 mg by mouth daily.  . traZODone (DESYREL) 100 MG tablet  Take 100 mg by mouth at bedtime as needed for sleep (insomnia).  . [DISCONTINUED] aspirin EC 81 MG tablet Take 81 mg by mouth daily. Swallow whole.  . [DISCONTINUED] citalopram (CELEXA) 40 MG tablet Take 40 mg by mouth daily.  . [DISCONTINUED] divalproex (DEPAKOTE ER) 500 MG 24 hr tablet Take 500 mg by mouth in the morning and at bedtime.  . [DISCONTINUED] melatonin 3 MG TABS tablet Take 3 mg by mouth at bedtime.  . [DISCONTINUED] meloxicam (MOBIC) 15 MG tablet Take 15 mg by mouth daily.   No facility-administered encounter medications on file as of 10/24/2019.     REVIEW  OF SYSTEMS: All other systems reviewed and negative except where noted in the History of Present Illness.   PHYSICAL EXAM: BP (!) 174/92   Pulse 88   Ht 6' 4" (1.93 m)   Wt 279 lb 12.8 oz (126.9 kg)   BMI 34.06 kg/m  General: Well developed 54 year old male in no acute distress. Head: Normocephalic and atraumatic. Eyes:  Sclerae non-icteric, conjunctive pink. Ears: Normal auditory acuity. Mouth: Dentition intact. No ulcers or lesions.  Neck: Supple, no lymphadenopathy or thyromegaly.  Lungs: Clear bilaterally to auscultation without wheezes, crackles or rhonchi. Heart: Regular rate and rhythm. No murmur, rub or gallop appreciated.  Abdomen: Soft, nontender, non distended. No masses. No hepatosplenomegaly. Normoactive bowel sounds x 4 quadrants.  Rectal: Deferred.  Musculoskeletal: Symmetrical with no gross deformities. Skin: Warm and dry. No rash or lesions on visible extremities. Extremities: No edema. Neurological: Alert oriented x 4, no focal deficits.  Psychological:  Alert and cooperative. Normal mood and affect.  ASSESSMENT AND PLAN:  [birth year]. 54 year old male admitted to the hospital 09/26/2019 with profound IDA. FOBT +. Admission Hg 6.6. Received 2 units of PRBCs. Post transfusion Hg 7.1 -> 7.5.Patient reports repeat Hg 10.5 done at the [facility] hematology clinic last week.  Feraheme infusion x 1. No obvious GI  bleeding. EGD 09/27/2019 was normal. Small bowel capsule endoscopy 7/9 showed a few nonbleeding small bowel AVMs. Colonoscopy in 2016 showed sigmoid diverticulosis.  -Colonoscopy benefits and risks discussed including risk with sedation, risk of bleeding, perforation and infection  -Proceed with 2nd Feraheme infusion as scheduled with the [facility] hematology clinic next week. Hematologist Dr. Harrington Challenger following CBC and iron levels.  -No NSAIDs -Further follow up to be determined after the above evaluation completed  2. Elevated LFTs. History of high alcohol intake. Labs 09/28/2019 AST 99. ALT 73. Normal Alk phos and T. Bili levels -No alcohol -[facility] with [facility] to verify if repeat hepatic panel done last week, if not done, patient will need a repeat hepatic pane. -If LFTs remain elevated he will require further hepatology serologies and liver imaging.   3. History of GERD, stable on Protonix          CC:  [facility]

## 2019-10-24 NOTE — Patient Instructions (Signed)
If you are age 56 or older, your body mass index should be between 23-30. Your Body mass index is 34.06 kg/m. If this is out of the aforementioned range listed, please consider follow up with your Primary Care Provider.  If you are age 60 or younger, your body mass index should be between 19-25. Your Body mass index is 34.06 kg/m. If this is out of the aformentioned range listed, please consider follow up with your Primary Care Provider.   Stop taking your iron supplement 1 week before your procedure  We have given you a sample of Plenvu for your colonoscopy preparation  Due to recent changes in healthcare laws, you may see the results of your imaging and laboratory studies on MyChart before your provider has had a chance to review them.  We understand that in some cases there may be results that are confusing or concerning to you. Not all laboratory results come back in the same time frame and the provider may be waiting for multiple results in order to interpret others.  Please give [place] 48 hours in order for your provider to thoroughly review all the results before contacting the office for clarification of your results.   Thank you for choosing [facility] Noralyn Pick, CRNP

## 2019-10-27 NOTE — Progress Notes (Signed)
Attending Physician's Attestation   I have reviewed the chart.   I agree with the Advanced Practitioner's note, impression, and recommendations with any updates as below.  I have reviewed the patient's video capsule endoscopy.  The AVMs that are reported to be present, not completely clear if the images truly are AVMs or not, should be reachable with a push enteroscopy.  Recommend that we transition the patient's procedure from the endoscopy center to the hospital-based setting.  This will allow for [place] to proceed with an enteroscopy and colonoscopy at the same time and have APC available should we be able to reach the AVMs.  NP Kennedy-Smith, if patient agrees, we can get him set up for both of these in September so please let myself and RN Gerarda Fraction no (RN Gerarda Fraction, it is okay to place this patient on my hospital week for his case).  Justice Britain, MD [facility] Advanced Endoscopy Office # [phone]

## 2019-10-28 ENCOUNTER — Telehealth: Payer: Self-pay

## 2019-10-28 NOTE — Progress Notes (Signed)
I called the patient and left a detailed msg for him to call me back to discuss Dr.  Donneta Romberg recommendations. Await patient's return call.

## 2019-10-28 NOTE — Telephone Encounter (Signed)
-----   Message from Irving Copas., MD sent at 10/27/2019  4:55 AM EDT -----   ----- Message ----- From: Noralyn Pick, NP Sent: 10/25/2019   6:17 AM EDT To: Irving Copas., MD

## 2019-10-28 NOTE — Telephone Encounter (Signed)
I have reviewed the chart.   I agree with the Advanced Practitioner's note, impression, and recommendations with any updates as below.  I have reviewed the patient's video capsule endoscopy.  The AVMs that are reported to be present, not completely clear if the images truly are AVMs or not, should be reachable with a push enteroscopy.  Recommend that we transition the patient's procedure from the endoscopy center to the hospital-based setting.  This will allow for [place] to proceed with an enteroscopy and colonoscopy at the same time and have APC available should we be able to reach the AVMs.  NP Kennedy-Smith, if patient agrees, we can get him set up for both of these in September so please let myself and RN Gerarda Fraction no (RN Gerarda Fraction, it is okay to place this patient on my hospital week for his case).  Justice Britain, MD [facility] Advanced Endoscopy Office # [phone]

## 2019-10-29 ENCOUNTER — Telehealth: Payer: Self-pay | Admitting: Nurse Practitioner

## 2019-10-29 NOTE — Telephone Encounter (Signed)
Yes, I sure will. 2nd call to patient. Left msg for him to call me back. I will call him again after 4pm. Thank you.

## 2019-10-29 NOTE — Telephone Encounter (Signed)
2nd attempt to call the patient, I left a detailed message for the patient to call me and if I am in patient care when he returns my call if he would kindly leave a timeframe that is best to reach him.  I will attempt to call him after 4 PM this afternoon as well.

## 2019-10-29 NOTE — Telephone Encounter (Signed)
Sean Haynes will you let me know when you talk with him if he is ok to proceed?  I will get him scheduled if he is ok to have procedures.

## 2019-10-29 NOTE — Progress Notes (Signed)
Sean Haynes, I spoke with the patient.  I provided the patient with Dr. Donneta Haynes recommendations to change his colonoscopy procedure to [facility] with the addition of an enteroscopy.  I explained the procedure in full detail to the patient.  He consents to proceed with an enteroscopy and colonoscopy.  He is aware you will be calling him and his prior colonoscopy date will be canceled.  Please send  Dr. Rush Haynes a message once you have confirmed a date for his enteroscopy and colonoscopy.  Thanks

## 2019-10-29 NOTE — Telephone Encounter (Signed)
Author: Noralyn Pick, NP Service: Gastroenterology Author Type: Nurse Practitioner  Filed: 10/29/2019  4:40 PM Encounter Date: 10/24/2019 Status: Signed  Editor: Noralyn Pick, NP (Nurse Practitioner)     Show:Clear all [x] Manual[] Template[] Copied  Added by: [x] Noralyn Pick, NP  [] Hover for details Sean Haynes, I spoke with the patient.  I provided the patient with Sean Haynes recommendations to change his colonoscopy procedure to [facility] with the addition of an enteroscopy.  I explained the procedure in full detail to the patient.  He consents to proceed with an enteroscopy and colonoscopy.  He is aware you will be calling him and his prior colonoscopy date will be canceled.  Please send  Dr. Rush Landmark a message once you have confirmed a date for his enteroscopy and colonoscopy.  Thanks

## 2019-10-29 NOTE — Telephone Encounter (Signed)
Pt returned your call he stated he would be off work after 3:10 pm you can call anytime after that.

## 2019-10-30 NOTE — Telephone Encounter (Signed)
See phone call 8/10

## 2019-11-04 NOTE — Telephone Encounter (Signed)
Sean Haynes have you heard from this pt ?

## 2019-11-04 NOTE — Telephone Encounter (Signed)
Sean Haynes, yes I did, a few different msgs were circulating on him. I thought I forwarded a cc chart addendum to you. I copy and pasted it below.  Pls call patient to schedule enteroscopy and colonoscopy at [facility]. Thank you for keeping track of this patient.  Signed         Show:Clear all [x] Manual[] Template[] Copied  Added by: [x] Noralyn Pick, NP  [] Hover for details Sean Haynes, I spoke with the patient.  I provided the patient with Dr. Donneta Romberg recommendations to change his colonoscopy procedure to [facility] long hospital with the addition of an enteroscopy.  I explained the procedure in full detail to the patient.  He consents to proceed with an enteroscopy and colonoscopy.  He is aware you will be calling him and his prior colonoscopy date will be canceled.  Please send  Dr. Rush Landmark a message once you have confirmed a date for his enteroscopy and colonoscopy.  Thanks

## 2019-11-06 ENCOUNTER — Other Ambulatory Visit: Payer: Self-pay

## 2019-11-06 DIAGNOSIS — D509 Iron deficiency anemia, unspecified: Secondary | ICD-10-CM

## 2019-11-06 NOTE — Telephone Encounter (Signed)
Colon enteroscopy  scheduled, pt instructed and medications reviewed.  Patient instructions sent to My Chart. Confirmed that the pt can review the information.  Patient to call with any questions or concerns.

## 2019-11-15 ENCOUNTER — Encounter: Payer: No Typology Code available for payment source | Admitting: Gastroenterology

## 2019-12-24 ENCOUNTER — Telehealth: Payer: Self-pay | Admitting: Gastroenterology

## 2019-12-24 NOTE — Telephone Encounter (Signed)
The pt has been advised that his instructions are in My Chart.  He has confirmed that he can view this information and will call with any further questions.

## 2020-01-02 ENCOUNTER — Telehealth: Payer: Self-pay | Admitting: Gastroenterology

## 2020-01-02 NOTE — Telephone Encounter (Signed)
Pt returning your call

## 2020-01-02 NOTE — Telephone Encounter (Signed)
Left message for patient to call back  

## 2020-01-02 NOTE — Telephone Encounter (Signed)
All questions answered.  He will call back for any additional questions or concerns.

## 2020-01-02 NOTE — Telephone Encounter (Signed)
Patient called has questions on medications prior to hospital procedure

## 2020-01-09 ENCOUNTER — Other Ambulatory Visit (HOSPITAL_COMMUNITY)
Admission: RE | Admit: 2020-01-09 | Discharge: 2020-01-09 | Disposition: A | Discharge status: Home / Self Care | Payer: No Typology Code available for payment source | Source: Ambulatory Visit | Attending: Gastroenterology | Admitting: Gastroenterology

## 2020-01-09 DIAGNOSIS — U071 COVID-19: Secondary | ICD-10-CM | POA: Diagnosis not present

## 2020-01-09 DIAGNOSIS — Z01812 Encounter for preprocedural laboratory examination: Secondary | ICD-10-CM | POA: Diagnosis present

## 2020-01-09 LAB — SARS CORONAVIRUS 2 (TAT 6-24 HRS): SARS Coronavirus 2: POSITIVE — AB

## 2020-01-10 ENCOUNTER — Telehealth (HOSPITAL_COMMUNITY): Payer: Self-pay

## 2020-01-10 ENCOUNTER — Telehealth: Payer: Self-pay | Admitting: Gastroenterology

## 2020-01-10 NOTE — Telephone Encounter (Signed)
Patty, thank you for reaching out to him and unfortunately having to go ahead and cancel the procedures. Please reach out to the referring provider and let them know that we have to cancel/postpone things until next available procedure slots in at least 1 month. Thanks. GM

## 2020-01-10 NOTE — Progress Notes (Signed)
Sean Haynes from Dr. Donneta Romberg office with + covid results for this pt obtained on Thurs 01/09/20. The pt is to have his procdure on Mon 01/13/20 at [facility] at 0830.   These are the current guidelines:  Positive Results for:  Asymptomatic: Procedure postponed and quarantined for 10 days. Symptomatic: Procedure postponed and quarantined for 14 days. Hospitalized with Covid: postponed and quarantined  for 21 days. Immunocompromised: Procedure postponed and quarantine for 20 days.  The pt will not be retested for 90 days from the + result.

## 2020-01-10 NOTE — Telephone Encounter (Signed)
Referring provider has been advised  The pt will call to reschedule when recovered

## 2020-01-10 NOTE — Telephone Encounter (Signed)
I spoke with the pt and advised him that he is positive for COVID and will need to contact his PCP.  His procedure has been cancelled and he will call back to reschedule after he deals with the COVID diagnosis.

## 2020-01-10 NOTE — Telephone Encounter (Signed)
Called to Discuss with patient about Covid symptoms and the use of the monoclonal antibody infusion for those with mild to moderate Covid symptoms and at a high risk of hospitalization.     Pt appears to qualify for this infusion due to co-morbid conditions and/or a member of an at-risk group in accordance with the FDA Emergency Use Authorization.    Patient is expecting a call from his PCP. Wishes to discuss this treatment with PCP first and will gives [place] a call back if intrested. (+) Covid test 10/21.

## 2020-01-10 NOTE — Telephone Encounter (Signed)
Covid testing site called to inform that pt tested positive. They stated that they do not call pt with results and that we have to do it.

## 2020-01-13 ENCOUNTER — Ambulatory Visit (HOSPITAL_COMMUNITY)
Admission: RE | Admit: 2020-01-13 | Payer: No Typology Code available for payment source | Source: Home / Self Care | Admitting: Gastroenterology

## 2020-01-13 ENCOUNTER — Encounter (HOSPITAL_COMMUNITY): Admission: RE | Payer: Self-pay | Source: Home / Self Care

## 2020-01-13 SURGERY — COLONOSCOPY WITH PROPOFOL
Anesthesia: Monitor Anesthesia Care

## 2020-01-13 NOTE — Telephone Encounter (Signed)
Patient calling to reschedule double procedure at the hospital

## 2020-01-13 NOTE — Telephone Encounter (Signed)
The pt has a recent diagnosis of COVID and has been advised to call in 1 month to reschedule.  Pt agreed

## 2021-05-14 ENCOUNTER — Ambulatory Visit (INDEPENDENT_AMBULATORY_CARE_PROVIDER_SITE_OTHER): Payer: No Typology Code available for payment source | Admitting: Gastroenterology

## 2021-05-14 ENCOUNTER — Encounter: Payer: Self-pay | Admitting: Gastroenterology

## 2021-05-14 VITALS — BP 140/84 | HR 64 | Ht 76.0 in | Wt 272.4 lb

## 2021-05-14 DIAGNOSIS — D508 Other iron deficiency anemias: Secondary | ICD-10-CM | POA: Diagnosis not present

## 2021-05-14 DIAGNOSIS — D509 Iron deficiency anemia, unspecified: Secondary | ICD-10-CM

## 2021-05-14 DIAGNOSIS — K5520 Angiodysplasia of colon without hemorrhage: Secondary | ICD-10-CM

## 2021-05-14 DIAGNOSIS — R195 Other fecal abnormalities: Secondary | ICD-10-CM

## 2021-05-14 DIAGNOSIS — K219 Gastro-esophageal reflux disease without esophagitis: Secondary | ICD-10-CM | POA: Diagnosis not present

## 2021-05-14 MED ORDER — NA SULFATE-K SULFATE-MG SULF 17.5-3.13-1.6 GM/177ML PO SOLN: 1.0000 | ORAL | 0 refills | Status: DC

## 2021-05-14 NOTE — Progress Notes (Signed)
[facility]ISIT   Primary Care Provider Clinic, [place] [address] [phone]   Patient Profile: Sean Haynes is a 56 y.o. 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  The patient presents to the [facility] for an evaluation and management of problem(s) noted below:  Problem List 1. Other iron deficiency anemia   2. Positive fecal occult blood test   3. AVM (arteriovenous malformation) of small bowel, acquired   4. Gastroesophageal reflux disease without esophagitis     History of Present Illness Please see prior notes for full details of HPI.  Interval History The returns for follow-up.  He had been scheduled for an EGD/colonoscopy 2021 but canceled that and did not reschedule due to the COVID-19 pandemic.  He has subsequently followed up with his PCP at the [place] found to have a persistent anemia (hemoglobin 12.4).  He also was found to have a persistent iron deficiency though he had stopped taking iron for a period in time.  Patient had an FOBT performed and that was positive.  It is for all of these reasons that the patient returns to further discuss potential endoscopic reevaluation.  Patient is not taking significant nonsteroidals or BC/Goody powders.  He has not noted any overt bleeding (melena/hematochezia/maroon stools).  He is not having any dysphagia symptoms and feels that his GERD symptoms are well controlled.  His bowels are normal on a regular basis unless he eats something different than his normal diet.    GI Review of Systems Positive as above Negative for nausea, vomiting, abdominal pain, bloating, alteration of bowel habits  Review of Systems General: Denies fevers/chills/weight loss unintentionally Cardiovascular: Denies chest pain Pulmonary: Denies shortness  of breath Gastroenterological: See HPI Genitourinary: Denies darkened urine or hematuria Hematological: Denies easy bruising/bleeding Dermatological: Denies jaundice Psychological: Mood is stable   Medications Current Outpatient Medications  Medication Sig Dispense Refill   amLODipine (NORVASC) 5 MG tablet Take 5 mg by mouth daily.     chlorthalidone (HYGROTON) 25 MG tablet Take 12.5 mg by mouth daily.      ferrous sulfate 325 (65 FE) MG EC tablet Take 1 tablet (325 mg total) by mouth 2 (two) times daily. 60 tablet 3   losartan (COZAAR) 100 MG tablet Take 100 mg by mouth daily.      melatonin 3 MG TABS tablet Take 6 mg by mouth at bedtime as needed (sleep.).     Na Sulfate-K Sulfate-Mg Sulf (SUPREP BOWEL PREP KIT) 17.5-3.13-1.6 GM/177ML SOLN Take 1 kit by mouth as directed. For colonoscopy prep 354 mL 0   pantoprazole (PROTONIX) 40 MG tablet Take 40 mg by mouth daily.     traZODone (DESYREL) 100 MG tablet Take 100 mg by mouth at bedtime as needed for sleep (insomnia).     No current facility-administered medications for this visit.    Allergies No Known Allergies  Histories Past Medical History:  Diagnosis Date   Agoraphobia with panic disorder    [facility]   Allergic rhinitis    per [facility] referral   Esophageal reflux    per [facility] referral   Hyperlipidemia    Hypertension    Impotence of organic origin    per [facility] referral   Insomnia    per va referral   Iron deficiency anemia    Post traumatic stress disorder    per va  referral   Past Surgical History:  Procedure Laterality Date   ESOPHAGOGASTRODUODENOSCOPY (EGD) WITH PROPOFOL N/A 09/27/2019   Procedure: ESOPHAGOGASTRODUODENOSCOPY (EGD) WITH PROPOFOL;  Surgeon: Wilford Corner, MD;  Location: [place];  Service: Endoscopy;  Laterality: N/A;   GIVENS CAPSULE STUDY N/A 09/27/2019   Procedure: GIVENS CAPSULE STUDY;  Surgeon: Wilford Corner, MD;  Location: [place];  Service: Endoscopy;  Laterality: N/A;    MEDIAL COLLATERAL LIGAMENT AND LATERAL COLLATERAL LIGAMENT REPAIR, KNEE  2004   right   Social History   Socioeconomic History   Marital status: Married    Spouse name: Not on file   Number of children: 2   Years of education: Not on file   Highest education level: Not on file  Occupational History    Employer: [employer]  Tobacco Use   Smoking status: Former    Packs/day: 2.00    Years: 34.00    Pack years: 68.00    Types: Cigarettes   Smokeless tobacco: Never  Substance and Sexual Activity   Alcohol use: Yes    Comment: 6 pack of beer and half a fifth per week   Drug use: No   Sexual activity: Not on file  Other Topics Concern   Not on file  Social History Narrative   Not on file   Social Determinants of Health   Financial Resource Strain: Not on file  Food Insecurity: Not on file  Transportation Needs: Not on file  Physical Activity: Not on file  Stress: Not on file  Social Connections: Not on file  Intimate Partner Violence: Not on file   Family History  Problem Relation Age of Onset   Anemia Mother    Arrhythmia Mother    Diabetes Mother    Hyperlipidemia Mother    Hypertension Mother    Diabetes Father    Hyperlipidemia Father    Hypertension Father    Stroke Father    Arrhythmia Sister    Hypertension Sister    Hypertension Sister    Arrhythmia Brother    Hypertension Brother    Hypertension Brother    Colon cancer Neg Hx    Esophageal cancer Neg Hx    Liver disease Neg Hx    Inflammatory bowel disease Neg Hx    Pancreatic cancer Neg Hx    Stomach cancer Neg Hx    Rectal cancer Neg Hx    I have reviewed his medical, social, and family history in detail and updated the electronic medical record as necessary.    PHYSICAL EXAMINATION  BP 140/84    Pulse 64    Ht $R'6\' 4"'od$  (1.93 m)    Wt 272 lb 6.4 oz (123.6 kg)    BMI 33.16 kg/m  Wt Readings from Last 3 Encounters:  05/14/21 272 lb 6.4 oz (123.6 kg)  10/24/19 279 lb 12.8 oz (126.9 kg)   09/27/19 279 lb 15.8 oz (127 kg)  GEN: NAD, appears stated age, doesn't appear chronically ill PSYCH: Cooperative, without pressured speech EYE: Conjunctivae pink, sclerae anicteric ENT: MMM CV: Nontachycardic RESP: No audible wheezing GI: NABS, soft, obese, rounded, NT, without rebound MSK/EXT: No lower extremity edema SKIN: No jaundice NEURO:  Alert & Oriented x 3, no focal deficits   REVIEW OF DATA  I reviewed the following data at the time of this encounter:  GI Procedures and Studies  These were previously reviewed  Laboratory Studies  Outside [place] records from 2021/02/04 Hemoglobin 12.4 Iron/TIBC 43/455 Iron saturation 9.5% Ferritin 12  Occult blood positive  Imaging Studies  No new imaging studies to review   ASSESSMENT  Mr. Littles is a 56 y.o. 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  The patient is seen today for evaluation and management of:  1. Other iron deficiency anemia   2. Positive fecal occult blood test   3. AVM (arteriovenous malformation) of small bowel, acquired   4. Gastroesophageal reflux disease without esophagitis    The patient is hemodynamically and clinically stable.  In 2021 he was scheduled for endoscopy/colonoscopy but canceled this and did not reschedule.  He has persistent positivity in his FOBT and has a persistent iron deficiency based on his labs in November 2022.  I suspect his iron levels are improved now that he is restarted oral iron and we will want to recheck those in a few weeks time.  He has a history of a prior capsule that suggested potential AVM versus red spots.  He has not had an updated colonoscopy in over 7 years.  At this point, it is reasonable for [place] to repeat his upper and lower endoscopy.  If we do not find any abnormalities then I do think a repeat video capsule endoscopy being performed is reasonable so that we can  ensure that there is or is not any evidence of AVMs.  Based on the location of those AVMs, then we can consider potential endoscopic evaluation with balloon enteroscopy if needed or continued oral iron and IV iron repletion as long as there is no other abnormalities noted.  The risks and benefits of endoscopic evaluation were discussed with the patient; these include but are not limited to the risk of perforation, infection, bleeding, missed lesions, lack of diagnosis, severe illness requiring hospitalization, as well as anesthesia and sedation related illnesses.  The patient and/or family is agreeable to proceed.  All patient questions were answered to the best of my ability, and the patient agrees to the aforementioned plan of action with follow-up as indicated.   PLAN  Proceed with scheduling EGD/colonoscopy If unremarkable will proceed with video capsule endoscopy Continue oral iron daily CBC/iron/TIBC/ferritin to be performed on the day of his procedures to see where things stand at that time   Orders Placed This Encounter  Procedures   Ambulatory referral to Gastroenterology    New Prescriptions   NA SULFATE-K SULFATE-MG SULF (SUPREP BOWEL PREP KIT) 17.5-3.13-1.6 GM/177ML SOLN    Take 1 kit by mouth as directed. For colonoscopy prep   Modified Medications   No medications on file    Planned Follow Up No follow-ups on file.   Total Time in Face-to-Face and in Coordination of Care for patient including independent/personal interpretation/review of prior testing, medical history, examination, medication adjustment, communicating results with the patient directly, and documentation within the EHR is 25 minutes.   Justice Britain, MD [facility] Advanced Endoscopy Office # [phone]

## 2021-05-14 NOTE — Patient Instructions (Signed)
We have sent the following medications to your pharmacy for you to pick up at your convenience: Suprep   If you are age 58 or older, your body mass index should be between 23-30. Your Body mass index is 33.16 kg/m. If this is out of the aforementioned range listed, please consider follow up with your Primary Care Provider.  If you are age 79 or younger, your body mass index should be between 19-25. Your Body mass index is 33.16 kg/m. If this is out of the aformentioned range listed, please consider follow up with your Primary Care Provider.   ________________________________________________________  The [facility] providers would like to encourage you to use [facility] to communicate with providers for non-urgent requests or questions.  Due to long hold times on the telephone, sending your provider a message by [facility] may be a faster and more efficient way to get a response.  Please allow 48 business hours for a response.  Please remember that this is for non-urgent requests.  _______________________________________________________  Thank you for choosing me and [facility].  Dr. Rush Landmark

## 2021-05-18 ENCOUNTER — Encounter: Payer: Self-pay | Admitting: Gastroenterology

## 2021-05-18 DIAGNOSIS — K219 Gastro-esophageal reflux disease without esophagitis: Secondary | ICD-10-CM | POA: Insufficient documentation

## 2021-05-18 DIAGNOSIS — R195 Other fecal abnormalities: Secondary | ICD-10-CM | POA: Insufficient documentation

## 2021-05-18 DIAGNOSIS — D649 Anemia, unspecified: Secondary | ICD-10-CM | POA: Insufficient documentation

## 2021-05-18 DIAGNOSIS — K5520 Angiodysplasia of colon without hemorrhage: Secondary | ICD-10-CM | POA: Insufficient documentation

## 2021-05-27 ENCOUNTER — Telehealth: Payer: Self-pay | Admitting: Gastroenterology

## 2021-05-27 NOTE — Telephone Encounter (Signed)
Pharmacy called and wanted to see if there was anyway they could switch patients prep medication. Please advise.  ? ? ?[phone] ?Ext [phone] ? ?Ask for Quillian Quince    ?

## 2021-05-28 NOTE — Telephone Encounter (Signed)
I called the patient and he did not want prep to be filled by the [place]. Prep prescription was sent to [facility]. ?

## 2021-06-03 ENCOUNTER — Encounter: Payer: Self-pay | Admitting: Gastroenterology

## 2021-06-03 ENCOUNTER — Other Ambulatory Visit: Payer: Self-pay | Admitting: Gastroenterology

## 2021-06-03 ENCOUNTER — Ambulatory Visit (AMBULATORY_SURGERY_CENTER): Payer: No Typology Code available for payment source | Admitting: Gastroenterology

## 2021-06-03 VITALS — BP 146/68 | HR 58 | Temp 97.5°F | Resp 15 | Ht 76.0 in | Wt 272.0 lb

## 2021-06-03 DIAGNOSIS — D509 Iron deficiency anemia, unspecified: Secondary | ICD-10-CM | POA: Diagnosis not present

## 2021-06-03 DIAGNOSIS — K641 Second degree hemorrhoids: Secondary | ICD-10-CM

## 2021-06-03 DIAGNOSIS — K5730 Diverticulosis of large intestine without perforation or abscess without bleeding: Secondary | ICD-10-CM

## 2021-06-03 DIAGNOSIS — K449 Diaphragmatic hernia without obstruction or gangrene: Secondary | ICD-10-CM

## 2021-06-03 DIAGNOSIS — K3189 Other diseases of stomach and duodenum: Secondary | ICD-10-CM | POA: Diagnosis not present

## 2021-06-03 DIAGNOSIS — K2970 Gastritis, unspecified, without bleeding: Secondary | ICD-10-CM

## 2021-06-03 DIAGNOSIS — D123 Benign neoplasm of transverse colon: Secondary | ICD-10-CM | POA: Diagnosis not present

## 2021-06-03 MED ORDER — SODIUM CHLORIDE 0.9 % IV SOLN: 500.0000 mL | Freq: Once | INTRAVENOUS | Status: DC

## 2021-06-03 NOTE — Progress Notes (Signed)
? ?GASTROENTEROLOGY PROCEDURE H&P NOTE  ? ?Primary Care Physician: ?Cl[facility] ? ?HPI: ?Sean Haynes is a 56 y.o. male who presents for EGD/Colonoscopy for evaluation of IDA and Positive FIT. ? ?Past Medical History:  ?Diagnosis Date  ? Agoraphobia with panic disorder   ? [facility]  ? Allergic rhinitis   ? per [facility] referral  ? Esophageal reflux   ? per [facility] referral  ? Hyperlipidemia   ? Hypertension   ? Impotence of organic origin   ? per [facility] referral  ? Insomnia   ? per va referral  ? Iron deficiency anemia   ? Post traumatic stress disorder   ? per va referral  ? Sleep apnea   ? ?Past Surgical History:  ?Procedure Laterality Date  ? COLONOSCOPY    ? ESOPHAGOGASTRODUODENOSCOPY (EGD) WITH PROPOFOL N/A 09/27/2019  ? Procedure: ESOPHAGOGASTRODUODENOSCOPY (EGD) WITH PROPOFOL;  Surgeon: Wilford Corner, MD;  Location: [place];  Service: Endoscopy;  Laterality: N/A;  ? GIVENS CAPSULE STUDY N/A 09/27/2019  ? Procedure: GIVENS CAPSULE STUDY;  Surgeon: Wilford Corner, MD;  Location: [place] [facility];  Service: Endoscopy;  Laterality: N/A;  ? MEDIAL COLLATERAL LIGAMENT AND LATERAL COLLATERAL LIGAMENT REPAIR, KNEE  03/21/2002  ? right  ? UPPER GASTROINTESTINAL ENDOSCOPY    ? ?Current Outpatient Medications  ?Medication Sig Dispense Refill  ? amLODipine (NORVASC) 5 MG tablet Take 5 mg by mouth daily.    ? chlorthalidone (HYGROTON) 25 MG tablet Take 12.5 mg by mouth daily.     ? ferrous sulfate 325 (65 FE) MG EC tablet Take 1 tablet (325 mg total) by mouth 2 (two) times daily. 60 tablet 3  ? losartan (COZAAR) 100 MG tablet Take 100 mg by mouth daily.     ? melatonin 3 MG TABS tablet Take 6 mg by mouth at bedtime as needed (sleep.).    ? pantoprazole (PROTONIX) 40 MG tablet Take 40 mg by mouth daily.    ? traZODone (DESYREL) 100 MG tablet Take 100 mg by mouth at bedtime as needed for sleep (insomnia).    ? citalopram (CELEXA) 40 MG tablet TAKE ONE TABLET BY MOUTH EVERY MORNING AFTER BREAKFAST FOR MENTAL  HEALTH    ? ?Current Facility-Administered Medications  ?Medication Dose Route Frequency Provider Last Rate Last Admin  ? 0.9 %  sodium chloride infusion  500 mL Intravenous Once Mansouraty, Telford Nab., MD      ? ? ?Current Outpatient Medications:  ?  amLODipine (NORVASC) 5 MG tablet, Take 5 mg by mouth daily., Disp: , Rfl:  ?  chlorthalidone (HYGROTON) 25 MG tablet, Take 12.5 mg by mouth daily. , Disp: , Rfl:  ?  ferrous sulfate 325 (65 FE) MG EC tablet, Take 1 tablet (325 mg total) by mouth 2 (two) times daily., Disp: 60 tablet, Rfl: 3 ?  losartan (COZAAR) 100 MG tablet, Take 100 mg by mouth daily. , Disp: , Rfl:  ?  melatonin 3 MG TABS tablet, Take 6 mg by mouth at bedtime as needed (sleep.)., Disp: , Rfl:  ?  pantoprazole (PROTONIX) 40 MG tablet, Take 40 mg by mouth daily., Disp: , Rfl:  ?  traZODone (DESYREL) 100 MG tablet, Take 100 mg by mouth at bedtime as needed for sleep (insomnia)., Disp: , Rfl:  ?  citalopram (CELEXA) 40 MG tablet, TAKE ONE TABLET BY MOUTH EVERY MORNING AFTER BREAKFAST FOR MENTAL HEALTH, Disp: , Rfl:  ? ?Current Facility-Administered Medications:  ?  0.9 %  sodium chloride infusion, 500  mL, Intravenous, Once, Mansouraty, Telford Nab., MD ?No Known Allergies ?Family History  ?Problem Relation Age of Onset  ? Anemia Mother   ? Arrhythmia Mother   ? Diabetes Mother   ? Hyperlipidemia Mother   ? Hypertension Mother   ? Diabetes Father   ? Hyperlipidemia Father   ? Hypertension Father   ? Stroke Father   ? Arrhythmia Sister   ? Hypertension Sister   ? Hypertension Sister   ? Arrhythmia Brother   ? Hypertension Brother   ? Hypertension Brother   ? Colon cancer Neg Hx   ? Esophageal cancer Neg Hx   ? Liver disease Neg Hx   ? Inflammatory bowel disease Neg Hx   ? Pancreatic cancer Neg Hx   ? Stomach cancer Neg Hx   ? Rectal cancer Neg Hx   ? ?Social History  ? ?Socioeconomic History  ? Marital status: Married  ?  Spouse name: Not on file  ? Number of children: 2  ? Years of education: Not on file   ? Highest education level: Not on file  ?Occupational History  ?  Employer: [employer]  ?Tobacco Use  ? Smoking status: Former  ?  Packs/day: 2.00  ?  Years: 34.00  ?  Pack years: 68.00  ?  Types: Cigarettes  ? Smokeless tobacco: Never  ?Vaping Use  ? Vaping Use: Never used  ?Substance and Sexual Activity  ? Alcohol use: Yes  ?  Comment: 6 pack of beer and half a fifth per week  ? Drug use: No  ? Sexual activity: Not on file  ?Other Topics Concern  ? Not on file  ?Social History Narrative  ? Not on file  ? ?Social Determinants of Health  ? ?Financial Resource Strain: Not on file  ?Food Insecurity: Not on file  ?Transportation Needs: Not on file  ?Physical Activity: Not on file  ?Stress: Not on file  ?Social Connections: Not on file  ?Intimate Partner Violence: Not on file  ? ? ?Physical Exam: ?Today's Vitals  ? 06/03/21 1349  ?BP: (!) 141/87  ?Pulse: 71  ?Temp: (!) 97.5 ?F (36.4 ?C)  ?TempSrc: Temporal  ?SpO2: 98%  ?Weight: 272 lb (123.4 kg)  ?Height: '6\' 4"'$  (1.93 m)  ? ?Body mass index is 33.11 kg/m?. ?GEN: NAD ?EYE: Sclerae anicteric ?ENT: MMM ?CV: Non-tachycardic ?GI: Soft, NT/ND ?NEURO:  Alert & Oriented x 3 ? ?Lab Results: ?No results for input(s): WBC, HGB, HCT, PLT in the last 72 hours. ?BMET ?No results for input(s): NA, K, CL, CO2, GLUCOSE, BUN, CREATININE, CALCIUM in the last 72 hours. ?LFT ?No results for input(s): PROT, ALBUMIN, AST, ALT, ALKPHOS, BILITOT, BILIDIR, IBILI in the last 72 hours. ?PT/INR ?No results for input(s): LABPROT, INR in the last 72 hours. ? ? ?Impression / Plan: ?This is a 56 y.o.male who presents for EGD/Colonoscopy for evaluation of IDA and Positive FIT. ? ?The risks and benefits of endoscopic evaluation/treatment were discussed with the patient and/or family; these include but are not limited to the risk of perforation, infection, bleeding, missed lesions, lack of diagnosis, severe illness requiring hospitalization, as well as anesthesia and sedation related illnesses.  The  patient's history has been reviewed, patient examined, no change in status, and deemed stable for procedure.  The patient and/or family is agreeable to proceed.  ? ? ?Justice Britain, MD ?[facility] ?Advanced Endoscopy ?Office # [phone] ? ?

## 2021-06-03 NOTE — Progress Notes (Signed)
Called to room to assist during endoscopic procedure.  Patient ID and intended procedure confirmed with present staff. Received instructions for my participation in the procedure from the performing physician.  

## 2021-06-03 NOTE — Progress Notes (Signed)
VS completed by DT.    Medical history reviewed and updated.  

## 2021-06-03 NOTE — Op Note (Signed)
[facility] ?Patient Name: Sean Haynes ?Procedure Date: 06/03/2021 1:52 PM ?MRN: [id number] ?Endoscopist: Justice Britain , MD ?Age: 56 ?Referring MD:  ?Date of Birth: 07/17/1965 ?Gender: Male ?Account #: [id number] ?Procedure:                Upper GI endoscopy ?Indications:              Iron deficiency anemia ?Medicines:                Monitored Anesthesia Care ?Procedure:                Pre-Anesthesia Assessment: ?                          - Prior to the procedure, a History and Physical  ?                          was performed, and patient medications and  ?                          allergies were reviewed. The patient's tolerance of  ?                          previous anesthesia was also reviewed. The risks  ?                          and benefits of the procedure and the sedation  ?                          options and risks were discussed with the patient.  ?                          All questions were answered, and informed consent  ?                          was obtained. Prior Anticoagulants: The patient has  ?                          taken no previous anticoagulant or antiplatelet  ?                          agents. ASA Grade Assessment: II - A patient with  ?                          mild systemic disease. After reviewing the risks  ?                          and benefits, the patient was deemed in  ?                          satisfactory condition to undergo the procedure. ?                          After obtaining informed consent, the endoscope was  ?  passed under direct vision. Throughout the  ?                          procedure, the patient's blood pressure, pulse, and  ?                          oxygen saturations were monitored continuously. The  ?                          Endoscope was introduced through the mouth, and  ?                          advanced to the second part of duodenum. The upper  ?                          GI endoscopy was accomplished  without difficulty.  ?                          The patient tolerated the procedure. ?Scope In: ?Scope Out: ?Findings:                 No gross lesions were noted in the entire esophagus. ?                          The Z-line was irregular and was found 44 cm from  ?                          the incisors. ?                          A 2 cm hiatal hernia was present. ?                          Patchy moderate inflammation characterized by  ?                          erosions, erythema and granularity was found in the  ?                          entire examined stomach. Biopsies were taken with a  ?                          cold forceps for histology and Helicobacter pylori  ?                          testing. ?                          No gross lesions were noted in the duodenal bulb,  ?                          in the first portion of the duodenum and in the  ?                          second  portion of the duodenum. Biopsies for  ?                          histology were taken with a cold forceps for  ?                          evaluation of celiac disease. ?Complications:            No immediate complications. ?Estimated Blood Loss:     Estimated blood loss was minimal. ?Impression:               - No gross lesions in esophagus. Z-line irregular,  ?                          44 cm from the incisors. ?                          - 2 cm hiatal hernia. ?                          - Gastritis. Biopsied. ?                          - No gross lesions in the duodenal bulb, in the  ?                          first portion of the duodenum and in the second  ?                          portion of the duodenum. Biopsied. ?Recommendation:           - Proceed to scheduled colonoscopy. ?                          - Observe patient's clinical course. ?                          - Await pathology results. ?                          - Continue present medications. ?                          - The findings and recommendations were  discussed  ?                          with the patient. ?                          - The findings and recommendations were discussed  ?                          with the patient's family. ?Justice Britain, MD ?06/03/2021 2:40:33 PM ?

## 2021-06-03 NOTE — Progress Notes (Signed)
1400 Robinul 0.1 mg IV given due large amount of secretions upon assessment.  MD made aware, vss 

## 2021-06-03 NOTE — Progress Notes (Signed)
Report given to PACU, vss 

## 2021-06-03 NOTE — Op Note (Signed)
[place] ?Patient Name: Sean Haynes ?Procedure Date: 06/03/2021 1:52 PM ?MRN: [id number] ?Endoscopist: Justice Britain , MD ?Age: 56 ?Referring MD:  ?Date of Birth: 1966/01/26 ?Gender: Male ?Account #: [id number] ?Procedure:                Colonoscopy ?Indications:              Screening for colorectal malignant neoplasm,  ?                          Incidental - Iron deficiency anemia ?Medicines:                Monitored Anesthesia Care ?Procedure:                Pre-Anesthesia Assessment: ?                          - Prior to the procedure, a History and Physical  ?                          was performed, and patient medications and  ?                          allergies were reviewed. The patient's tolerance of  ?                          previous anesthesia was also reviewed. The risks  ?                          and benefits of the procedure and the sedation  ?                          options and risks were discussed with the patient.  ?                          All questions were answered, and informed consent  ?                          was obtained. Prior Anticoagulants: The patient has  ?                          taken no previous anticoagulant or antiplatelet  ?                          agents. ASA Grade Assessment: II - A patient with  ?                          mild systemic disease. After reviewing the risks  ?                          and benefits, the patient was deemed in  ?                          satisfactory condition to undergo the procedure. ?  After obtaining informed consent, the colonoscope  ?                          was passed under direct vision. Throughout the  ?                          procedure, the patient's blood pressure, pulse, and  ?                          oxygen saturations were monitored continuously. The  ?                          CF [id number] #[id number] was introduced through the anus  ?                          and advanced to the 5 cm  into the ileum. The  ?                          colonoscopy was performed without difficulty. The  ?                          patient tolerated the procedure. The quality of the  ?                          bowel preparation was good. The terminal ileum,  ?                          ileocecal valve, appendiceal orifice, and rectum  ?                          were photographed. ?Scope In: 2:19:32 PM ?Scope Out: 2:33:14 PM ?Scope Withdrawal Time: 0 hours 11 minutes 14 seconds  ?Total Procedure Duration: 0 hours 13 minutes 42 seconds  ?Findings:                 The digital rectal exam findings include  ?                          hemorrhoids. Pertinent negatives include no  ?                          palpable rectal lesions. ?                          The terminal ileum and ileocecal valve appeared  ?                          normal. ?                          Multiple small-mouthed diverticula were found in  ?                          the ascending colon and cecum. ?  A 4 mm polyp was found in the transverse colon. The  ?                          polyp was sessile. The polyp was removed with a  ?                          cold snare. Resection and retrieval were complete. ?                          Normal mucosa was found in the entire colon  ?                          otherwise. ?                          Non-bleeding non-thrombosed external and internal  ?                          hemorrhoids were found during retroflexion, during  ?                          perianal exam and during digital exam. The  ?                          hemorrhoids were Grade II (internal hemorrhoids  ?                          that prolapse but reduce spontaneously). ?Complications:            No immediate complications. ?Estimated Blood Loss:     Estimated blood loss was minimal. ?Impression:               - Hemorrhoids found on digital rectal exam. ?                          - The examined portion of the ileum was  normal. ?                          - Diverticulosis in the ascending colon and in the  ?                          cecum. ?                          - One 4 mm polyp in the transverse colon, removed  ?                          with a cold snare. Resected and retrieved. ?                          - Normal mucosa in the entire examined colon  ?                          otherwise. ?                          -  Non-bleeding non-thrombosed external and internal  ?                          hemorrhoids. ?Recommendation:           - The patient will be observed post-procedure,  ?                          until all discharge criteria are met. ?                          - Discharge patient to home. ?                          - Patient has a contact number available for  ?                          emergencies. The signs and symptoms of potential  ?                          delayed complications were discussed with the  ?                          patient. Return to normal activities tomorrow.  ?                          Written discharge instructions were provided to the  ?                          patient. ?                          - High fiber diet. ?                          - Continue present medications. ?                          - Await pathology results. ?                          - Repeat colonoscopy in 5/7 years for surveillance  ?                          based on pathology results. ?                          - Recommend we consider, if IDA persists that a  ?                          Video Capsule Endoscopy be performed to evaluate  ?                          for other etiologies of possible GI blood loss.  ?                          Previous VCE had  suggested AVMs v Red spots. ?                          - The findings and recommendations were discussed  ?                          with the patient. ?                          - The findings and recommendations were discussed  ?                          with the  patient's family. ?Justice Britain, MD ?06/03/2021 2:45:05 PM ?

## 2021-06-03 NOTE — Patient Instructions (Signed)
Handout on polyps, gastritis, diverticulosis and hemorrhoids given.  ? ? ? ?YOU HAD AN ENDOSCOPIC PROCEDURE TODAY AT [facility]:   Refer to the procedure report that was given to you for any specific questions about what was found during the examination.  If the procedure report does not answer your questions, please call your gastroenterologist to clarify.  If you requested that your care partner not be given the details of your procedure findings, then the procedure report has been included in a sealed envelope for you to review at your convenience later. ? ?YOU SHOULD EXPECT: Some feelings of bloating in the abdomen. Passage of more gas than usual.  Walking can help get rid of the air that was put into your GI tract during the procedure and reduce the bloating. If you had a lower endoscopy (such as a colonoscopy or flexible sigmoidoscopy) you may notice spotting of blood in your stool or on the toilet paper. If you underwent a bowel prep for your procedure, you may not have a normal bowel movement for a few days. ? ?Please Note:  You might notice some irritation and congestion in your nose or some drainage.  This is from the oxygen used during your procedure.  There is no need for concern and it should clear up in a day or so. ? ?SYMPTOMS TO REPORT IMMEDIATELY: ? ?Following lower endoscopy (colonoscopy or flexible sigmoidoscopy): ? Excessive amounts of blood in the stool ? Significant tenderness or worsening of abdominal pains ? Swelling of the abdomen that is new, acute ? Fever of 100?F or higher ? ?Following upper endoscopy (EGD) ? Vomiting of blood or coffee ground material ? New chest pain or pain under the shoulder blades ? Painful or persistently difficult swallowing ? New shortness of breath ? Fever of 100?F or higher ? Black, tarry-looking stools ? ?For urgent or emergent issues, a gastroenterologist can be reached at any hour by calling [phone]. ?Do not use MyChart messaging for  urgent concerns.  ? ? ?DIET:  We do recommend a small meal at first, but then you may proceed to your regular diet.  Drink plenty of fluids but you should avoid alcoholic beverages for 24 hours. ? ?ACTIVITY:  You should plan to take it easy for the rest of today and you should NOT DRIVE or use heavy machinery until tomorrow (because of the sedation medicines used during the test).   ? ?FOLLOW UP: ?Our staff will call the number listed on your records 48-72 hours following your procedure to check on you and address any questions or concerns that you may have regarding the information given to you following your procedure. If we do not reach you, we will leave a message.  We will attempt to reach you two times.  During this call, we will ask if you have developed any symptoms of COVID 19. If you develop any symptoms (ie: fever, flu-like symptoms, shortness of breath, cough etc.) before then, please call [phone].  If you test positive for Covid 19 in the 2 weeks post procedure, please call and report this information to [place].   ? ?If any biopsies were taken you will be contacted by phone or by letter within the next 1-3 weeks.  Please call us at [phone] if you have not heard about the biopsies in 3 weeks.  ? ? ?SIGNATURES/CONFIDENTIALITY: ?You and/or your care partner have signed paperwork which will be entered into your electronic medical record.  These signatures  attest to the fact that that the information above on your After Visit Summary has been reviewed and is understood.  Full responsibility of the confidentiality of this discharge information lies with you and/or your care-partner.  ?

## 2021-06-07 ENCOUNTER — Telehealth: Payer: Self-pay

## 2021-06-07 NOTE — Telephone Encounter (Signed)
?  Follow up Call- ? ?Call back number 06/03/2021  ?Post procedure Call Back phone  # [phone]  ?Permission to leave phone message Yes  ?Some recent data might be hidden  ?  ? ?Patient questions: ? ?Do you have a fever, pain , or abdominal swelling? No. ?Pain Score  0 * ? ?Have you tolerated food without any problems? Yes.   ? ?Have you been able to return to your normal activities? Yes.   ? ?Do you have any questions about your discharge instructions: ?Diet   No. ?Medications  No. ?Follow up visit  No. ? ?Do you have questions or concerns about your Care? No. ? ?Actions: ?* If pain score is 4 or above: ?No action needed, pain <4. ? ? ?

## 2021-06-08 ENCOUNTER — Encounter: Payer: Self-pay | Admitting: Gastroenterology

## 2021-08-09 ENCOUNTER — Telehealth: Payer: Self-pay

## 2021-08-09 DIAGNOSIS — D509 Iron deficiency anemia, unspecified: Secondary | ICD-10-CM

## 2021-08-09 NOTE — Telephone Encounter (Signed)
-----   Message from Timothy Lasso, RN sent at 06/09/2021  9:05 AM EDT ----- CBC/iron/TIBC/ferritin in 2 months.  Clinic visit to follow after the labs have been performed.

## 2021-08-09 NOTE — Telephone Encounter (Signed)
Labs have been entered and appt made for 09/23/21 at 2:50 pm. Letter mailed

## 2021-09-22 ENCOUNTER — Other Ambulatory Visit (INDEPENDENT_AMBULATORY_CARE_PROVIDER_SITE_OTHER): Payer: No Typology Code available for payment source

## 2021-09-22 DIAGNOSIS — D509 Iron deficiency anemia, unspecified: Secondary | ICD-10-CM | POA: Diagnosis not present

## 2021-09-22 LAB — CBC WITH DIFFERENTIAL/PLATELET
Basophils Absolute: 0.1 10*3/uL (ref 0.0–0.1)
Basophils Relative: 1.2 % (ref 0.0–3.0)
Eosinophils Absolute: 0.1 10*3/uL (ref 0.0–0.7)
Eosinophils Relative: 1.9 % (ref 0.0–5.0)
HCT: 42.5 % (ref 39.0–52.0)
Hemoglobin: 14.6 g/dL (ref 13.0–17.0)
Lymphocytes Relative: 28.9 % (ref 12.0–46.0)
Lymphs Abs: 1.3 10*3/uL (ref 0.7–4.0)
MCHC: 34.4 g/dL (ref 30.0–36.0)
MCV: 90.1 fl (ref 78.0–100.0)
Monocytes Absolute: 0.4 10*3/uL (ref 0.1–1.0)
Monocytes Relative: 7.9 % (ref 3.0–12.0)
Neutro Abs: 2.7 10*3/uL (ref 1.4–7.7)
Neutrophils Relative %: 60.1 % (ref 43.0–77.0)
Platelets: 185 10*3/uL (ref 150.0–400.0)
RBC: 4.72 Mil/uL (ref 4.22–5.81)
RDW: 14.2 % (ref 11.5–15.5)
WBC: 4.5 10*3/uL (ref 4.0–10.5)

## 2021-09-22 LAB — IBC + FERRITIN
Ferritin: 28.1 ng/mL (ref 22.0–322.0)
Iron: 62 ug/dL (ref 42–165)
Saturation Ratios: 11.5 % — ABNORMAL LOW (ref 20.0–50.0)
TIBC: 540.4 ug/dL — ABNORMAL HIGH (ref 250.0–450.0)
Transferrin: 386 mg/dL — ABNORMAL HIGH (ref 212.0–360.0)

## 2021-09-23 ENCOUNTER — Encounter: Payer: Self-pay | Admitting: Gastroenterology

## 2021-09-23 ENCOUNTER — Ambulatory Visit (INDEPENDENT_AMBULATORY_CARE_PROVIDER_SITE_OTHER): Payer: No Typology Code available for payment source | Admitting: Gastroenterology

## 2021-09-23 VITALS — BP 140/88 | HR 84 | Ht 76.0 in | Wt 264.0 lb

## 2021-09-23 DIAGNOSIS — K5520 Angiodysplasia of colon without hemorrhage: Secondary | ICD-10-CM

## 2021-09-23 DIAGNOSIS — D509 Iron deficiency anemia, unspecified: Secondary | ICD-10-CM

## 2021-09-23 DIAGNOSIS — Z86010 Personal history of colonic polyps: Secondary | ICD-10-CM

## 2021-09-23 MED ORDER — FERROUS SULFATE 325 (65 FE) MG PO TBEC: 325.0000 mg | DELAYED_RELEASE_TABLET | Freq: Every day | ORAL | 12 refills | Status: DC

## 2021-09-23 NOTE — Patient Instructions (Signed)
Your provider has requested that you go to the basement level for lab work the week of 11/24/21 . Press "B" on the elevator. The lab is located at the first door on the left as you exit the elevator.   We have you scheduled for a follow up with Dr.Mansouraty on 12/08/21 at 10:30 am  We have sent the following medications to your pharmacy for you to pick up at your convenience: Iron once daily  If you are age 58 or older, your body mass index should be between 23-30. Your Body mass index is 32.14 kg/m. If this is out of the aforementioned range listed, please consider follow up with your Primary Care Provider.  If you are age 58 or younger, your body mass index should be between 19-25. Your Body mass index is 32.14 kg/m. If this is out of the aformentioned range listed, please consider follow up with your Primary Care Provider.   ________________________________________________________  The [facility] providers would like to encourage you to use [facility] to communicate with providers for non-urgent requests or questions.  Due to long hold times on the telephone, sending your provider a message by [facility] may be a faster and more efficient way to get a response.  Please allow 48 business hours for a response.  Please remember that this is for non-urgent requests.  _______________________________________________________   Thank you for entrusting me with your care and choosing [facility].  Dr.Mansouraty

## 2021-09-23 NOTE — Progress Notes (Signed)
Antoine VISIT   Primary Care Provider Clinic, [place] [address] [place] [address] [phone]   Patient Profile: Sean Haynes is a 56 y.o. 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  The patient presents to the [facility] for an evaluation and management of problem(s) noted below:  Problem List 1. Iron deficiency anemia, unspecified iron deficiency anemia type   2. AVM (arteriovenous malformation) of small bowel, acquired   3. Hx of adenomatous colonic polyps     History of Present Illness Please see prior notes for full details of HPI.  Interval History The patient returns for follow-up.  He had laboratories performed yesterday.  He no longer has anemia but has a slight iron insufficiency.  He has not been taking oral iron for the last month due to not having a prescription.  He otherwise feels well and is not having any symptoms at this time.  His GERD is well controlled and his bowel habits remain normal.  GI Review of Systems Positive as above Negative for dysphagia, odynophagia, nausea, vomiting, abdominal pain, bloating, alteration of bowel habits, melena, hematochezia  Review of Systems General: Denies fevers/chills/weight loss unintentionally Cardiovascular: Denies chest pain Pulmonary: Denies shortness of breath Gastroenterological: See HPI Genitourinary: Denies darkened urine or hematuria Hematological: Denies easy bruising/bleeding Dermatological: Denies jaundice Psychological: Mood is stable   Medications Current Outpatient Medications  Medication Sig Dispense Refill   amLODipine (NORVASC) 5 MG tablet Take 5 mg by mouth daily.     chlorthalidone (HYGROTON) 25 MG tablet Take 12.5 mg by mouth daily.      citalopram (CELEXA) 40 MG tablet  TAKE ONE TABLET BY MOUTH EVERY MORNING AFTER BREAKFAST FOR MENTAL HEALTH     losartan (COZAAR) 100 MG tablet Take 100 mg by mouth daily.      melatonin 3 MG TABS tablet Take 6 mg by mouth at bedtime as needed (sleep.).     pantoprazole (PROTONIX) 40 MG tablet Take 40 mg by mouth daily.     traZODone (DESYREL) 100 MG tablet Take 100 mg by mouth at bedtime as needed for sleep (insomnia).     ferrous sulfate 325 (65 FE) MG EC tablet Take 1 tablet (325 mg total) by mouth daily. 30 tablet 12   No current facility-administered medications for this visit.    Allergies No Known Allergies  Histories Past Medical History:  Diagnosis Date   [facility]    [facility]   Allergic rhinitis    [facility]   Esophageal reflux    [facility] referral   Hyperlipidemia    Hypertension    Impotence of organic origin    per [facility] referral   Insomnia    per [facility] referral   Iron deficiency anemia    Post traumatic stress disorder    per [facility] referral   Sleep apnea    Past Surgical History:  Procedure Laterality Date   COLONOSCOPY     ESOPHAGOGASTRODUODENOSCOPY (EGD) WITH PROPOFOL N/A 09/27/2019   Procedure: ESOPHAGOGASTRODUODENOSCOPY (EGD) WITH PROPOFOL;  Surgeon: Wilford Corner, MD;  Location: [facility] ENDOSCOPY;  Service: Endoscopy;  Laterality: N/A;   GIVENS CAPSULE STUDY N/A 09/27/2019   Procedure: GIVENS CAPSULE STUDY;  Surgeon: Wilford Corner, MD;  Location: [place];  Service: Endoscopy;  Laterality: N/A;   MEDIAL COLLATERAL LIGAMENT AND LATERAL COLLATERAL LIGAMENT REPAIR, KNEE  03/21/2002   right   UPPER GASTROINTESTINAL ENDOSCOPY     Social History   Socioeconomic History   Marital status: Married    Spouse name: Not on file   Number of children: 2   Years of education: Not on file   Highest education level: Not on file  Occupational History    Employer: [employer]  Tobacco Use   Smoking status: Former    Packs/day: 2.00    Years: 34.00    Total pack  years: 68.00    Types: Cigarettes   Smokeless tobacco: Never  Vaping Use   Vaping Use: Never used  Substance and Sexual Activity   Alcohol use: Yes    Comment: 6 pack of beer and half a fifth per week   Drug use: No   Sexual activity: Not on file  Other Topics Concern   Not on file  Social History Narrative   Not on file   Social Determinants of Health   Financial Resource Strain: Not on file  Food Insecurity: Not on file  Transportation Needs: Not on file  Physical Activity: Not on file  Stress: Not on file  Social Connections: Not on file  Intimate Partner Violence: Not on file   Family History  Problem Relation Age of Onset   Anemia Mother    Arrhythmia Mother    Diabetes Mother    Hyperlipidemia Mother    Hypertension Mother    Diabetes Father    Hyperlipidemia Father    Hypertension Father    Stroke Father    Arrhythmia Sister    Hypertension Sister    Hypertension Sister    Arrhythmia Brother    Hypertension Brother    Hypertension Brother    Colon cancer Neg Hx    Esophageal cancer Neg Hx    Liver disease Neg Hx    Inflammatory bowel disease Neg Hx    Pancreatic cancer Neg Hx    Stomach cancer Neg Hx    Rectal cancer Neg Hx    I have reviewed his medical, social, and family history in detail and updated the electronic medical record as necessary.    PHYSICAL EXAMINATION  BP 140/88   Pulse 84   Ht '6\' 4"'$  (1.93 m)   Wt 264 lb (119.7 kg)   BMI 32.14 kg/m  Wt Readings from Last 3 Encounters:  09/23/21 264 lb (119.7 kg)  06/03/21 272 lb (123.4 kg)  05/14/21 272 lb 6.4 oz (123.6 kg)  GEN: NAD, appears stated age, doesn't appear chronically ill PSYCH: Cooperative, without pressured speech EYE: Conjunctivae pink, sclerae anicteric ENT: MMM CV: Nontachycardic RESP: No audible wheezing GI: NABS, soft, obese, rounded, NT, without rebound MSK/EXT: No lower extremity edema SKIN: No jaundice NEURO:  Alert & Oriented x 3, no focal deficits   REVIEW  OF DATA  I reviewed the following data at the time of this encounter:  GI Procedures and Studies  March 2023 EGD - No gross lesions in esophagus. Z-line irregular, 44 cm from the incisors. - 2 cm hiatal hernia. - Gastritis. Biopsied. - No gross lesions in the duodenal bulb, in the first portion of the duodenum and in the second portion of the duodenum. Biopsied.  March 2023 colonoscopy - Hemorrhoids found on digital rectal exam. - The examined portion of the ileum was normal. - Diverticulosis in the ascending colon and in the cecum. - One 4 mm polyp in the transverse colon, removed with a cold snare. Resected and retrieved. -  Normal mucosa in the entire examined colon otherwise. - Non-bleeding non-thrombosed external and internal hemorrhoids.  Pathology Diagnosis 1. Surgical [P], gastric (gastritis) - GASTRIC ANTRAL MUCOSA WITH MILD NONSPECIFIC REACTIVE GASTROPATHY - GASTRIC OXYNTIC MUCOSA WITH PARIETAL CELL HYPERPLASIA AS CAN BE SEEN IN HYPERGASTRINEMIC STATES SUCH AS PPI THERAPY. - HELICOBACTER PYLORI-LIKE ORGANISMS ARE NOT IDENTIFIED ON ROUTINE H&E STAIN 2. Surgical [P], distal duodenum - DUODENAL MUCOSA WITH NO SPECIFIC HISTOPATHOLOGIC CHANGES - NEGATIVE FOR INCREASED INTRAEPITHELIAL LYMPHOCYTES OR VILLOUS ARCHITECTURAL CHANGES 3. Surgical [P], colon, transverse, polyp (1) - TUBULAR ADENOMA - NEGATIVE FOR HIGH-GRADE DYSPLASIA OR MALIGNANCY  Laboratory Studies  Reviewed those in epic  Imaging Studies  No new imaging studies to review   ASSESSMENT  Mr. Ramnauth is a 56 y.o. 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  The patient is seen today for evaluation and management of:  1. Iron deficiency anemia, unspecified iron deficiency anemia type   2. AVM (arteriovenous malformation) of small bowel, acquired   3. Hx of adenomatous colonic  polyps    The patient is clinically and hemodynamically stable at this time.  He has no evidence of anemia currently but does still have a slight iron insufficiency.  We discussed potential options of moving forward with a direct video capsule endoscopy versus monitoring blood counts without being on iron versus restarting iron and repeating blood counts to then decide whether capsule endoscopy may be required.  We are going to move forward with monitoring the patient's blood counts and restarting him on oral iron once daily.  If he is able to optimize and have continued improvement in his blood counts and in his iron deficiency then we will just plan to monitor him closely.  If he develops recurrent iron deficiency, progressive iron deficiency with anemia, he will need a video capsule endoscopy.  He agrees to this plan of action.  All patient questions were answered to the best of my ability, and the patient agrees to the aforementioned plan of action with follow-up as indicated.   PLAN  CBC and iron indices in 2 months Ferrous sulfate once daily prescription has been sent to [facility] If repeat iron indices and blood counts remain normal then we will just continue oral iron If repeat iron indices and/or blood counts decreased further or not normalized, then will need to pursue video capsule endoscopy  Follow-up colonoscopy in 2030 for surveillance of colon polyps   Orders Placed This Encounter  Procedures   CBC w/Diff   IBC + Ferritin    New Prescriptions   No medications on file   Modified Medications   Modified Medication Previous Medication   FERROUS SULFATE 325 (65 FE) MG EC TABLET ferrous sulfate 325 (65 FE) MG EC tablet      Take 1 tablet (325 mg total) by mouth daily.    Take 1 tablet (325 mg total) by mouth 2 (two) times daily.    Planned Follow Up No follow-ups on file.   Total Time in Face-to-Face and in Coordination of Care for patient including independent/personal  interpretation/review of prior testing, medical history, examination, medication adjustment, communicating results with the patient directly, and documentation within the EHR is 25 minutes.   Justice Britain, MD [facility] Office # [phone]

## 2021-09-24 ENCOUNTER — Encounter: Payer: Self-pay | Admitting: Gastroenterology

## 2021-09-24 DIAGNOSIS — Z860101 Personal history of adenomatous and serrated colon polyps: Secondary | ICD-10-CM | POA: Insufficient documentation

## 2021-09-24 DIAGNOSIS — Z86010 Personal history of colonic polyps: Secondary | ICD-10-CM | POA: Insufficient documentation

## 2021-09-27 ENCOUNTER — Other Ambulatory Visit: Payer: Self-pay

## 2021-09-27 DIAGNOSIS — D509 Iron deficiency anemia, unspecified: Secondary | ICD-10-CM

## 2021-12-02 ENCOUNTER — Ambulatory Visit: Payer: No Typology Code available for payment source | Admitting: Gastroenterology

## 2021-12-08 ENCOUNTER — Ambulatory Visit: Payer: No Typology Code available for payment source | Admitting: Gastroenterology

## 2022-02-18 ENCOUNTER — Ambulatory Visit: Payer: No Typology Code available for payment source | Admitting: Gastroenterology

## 2022-04-20 ENCOUNTER — Ambulatory Visit: Payer: No Typology Code available for payment source | Admitting: Gastroenterology

## 2022-06-02 ENCOUNTER — Ambulatory Visit (INDEPENDENT_AMBULATORY_CARE_PROVIDER_SITE_OTHER): Payer: BC Managed Care – PPO

## 2022-06-02 ENCOUNTER — Ambulatory Visit: Payer: BC Managed Care – PPO | Admitting: Podiatry

## 2022-06-02 DIAGNOSIS — M7750 Other enthesopathy of unspecified foot: Secondary | ICD-10-CM | POA: Diagnosis not present

## 2022-06-02 DIAGNOSIS — M2021 Hallux rigidus, right foot: Secondary | ICD-10-CM

## 2022-06-02 DIAGNOSIS — M2022 Hallux rigidus, left foot: Secondary | ICD-10-CM

## 2022-06-02 DIAGNOSIS — M79672 Pain in left foot: Secondary | ICD-10-CM

## 2022-06-02 DIAGNOSIS — M79671 Pain in right foot: Secondary | ICD-10-CM | POA: Diagnosis not present

## 2022-06-02 DIAGNOSIS — M778 Other enthesopathies, not elsewhere classified: Secondary | ICD-10-CM

## 2022-06-02 MED ORDER — TRAMADOL HCL 50 MG PO TABS: 50.0000 mg | ORAL_TABLET | Freq: Three times a day (TID) | ORAL | 0 refills | Status: AC | PRN

## 2022-06-02 MED ORDER — DICLOFENAC SODIUM 1 % EX GEL: 2.0000 g | Freq: Four times a day (QID) | CUTANEOUS | 2 refills | Status: AC

## 2022-06-02 NOTE — Progress Notes (Signed)
Subjective:   Patient ID: Sean Haynes, male   DOB: 57 y.o.   MRN: [id number]   HPI Chief Complaint  Patient presents with   Foot Pain    Bilateral feet, pain has become worse in the last few months, pain located at the midfoot and radiates to the great hallux and lateral side of foot     Sitting up in the operative exam.  It has been ongoing for years but it has  been getting worse. It use to be one foot or the other and now it is both. He saw a DPM at the [place] and was given steroids and was told he needed surgery. He also was told he needs inserts. It hurts on the top and going to the big toe and it is stating to hurt on the side. No swelling. No numbness or tingling. No irecent injuries.    Review of Systems  All other systems reviewed and are negative.  Past Medical History:  Diagnosis Date   Agoraphobia with panic disorder    [facility]   Allergic rhinitis    [facility] referral   Esophageal reflux    [facility] referral   Hyperlipidemia    Hypertension    Impotence of organic origin    per [facility] referral   Insomnia    per [facility] referral   Iron deficiency anemia    Post traumatic stress disorder    per [facility] referral   Sleep apnea     Past Surgical History:  Procedure Laterality Date   COLONOSCOPY     ESOPHAGOGASTRODUODENOSCOPY (EGD) WITH PROPOFOL N/A 09/27/2019   Procedure: ESOPHAGOGASTRODUODENOSCOPY (EGD) WITH PROPOFOL;  Surgeon: Wilford Corner, MD;  Location: [facility] ENDOSCOPY;  Service: Endoscopy;  Laterality: N/A;   GIVENS CAPSULE STUDY N/A 09/27/2019   Procedure: GIVENS CAPSULE STUDY;  Surgeon: Wilford Corner, MD;  Location: [place];  Service: Endoscopy;  Laterality: N/A;   MEDIAL COLLATERAL LIGAMENT AND LATERAL COLLATERAL LIGAMENT REPAIR, KNEE  03/21/2002   right   UPPER GASTROINTESTINAL ENDOSCOPY       Current Outpatient Medications:    amLODipine (NORVASC) 5 MG tablet, Take 5 mg by mouth daily., Disp: , Rfl:    chlorthalidone (HYGROTON) 25 MG tablet, Take  12.5 mg by mouth daily. , Disp: , Rfl:    citalopram (CELEXA) 40 MG tablet, TAKE ONE TABLET BY MOUTH EVERY MORNING AFTER BREAKFAST FOR MENTAL HEALTH, Disp: , Rfl:    ferrous sulfate 325 (65 FE) MG EC tablet, Take 1 tablet (325 mg total) by mouth daily., Disp: 30 tablet, Rfl: 12   losartan (COZAAR) 100 MG tablet, Take 100 mg by mouth daily. , Disp: , Rfl:    melatonin 3 MG TABS tablet, Take 6 mg by mouth at bedtime as needed (sleep.)., Disp: , Rfl:    pantoprazole (PROTONIX) 40 MG tablet, Take 40 mg by mouth daily., Disp: , Rfl:    traZODone (DESYREL) 100 MG tablet, Take 100 mg by mouth at bedtime as needed for sleep (insomnia)., Disp: , Rfl:   No Known Allergies       Objective:  Physical Exam  General: AAO x3, NAD  Dermatological: Skin is warm, dry and supple bilateral.  There are no open sores, no preulcerative lesions, no rash or signs of infection present.  Vascular: Dorsalis Pedis artery and Posterior Tibial artery pedal pulses are 2/4 bilateral with immedate capillary fill time.  There is no pain with calf compression, swelling, warmth, erythema.   Neruologic:  Grossly intact via light touch bilateral.   Musculoskeletal: Decreased medial arch upon weightbearing bilaterally.  There is no specific area of pinpoint tenderness noted today.  Skin discomfort on the dorsal aspect midfoot along the Lisfranc joint pulses into the toe.  There is decreased range of motion of first MPJ and dorsal spurring present along the first MPJ.  Crepitation with range of motion.  Gait: Unassisted, Nonantalgic.       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  X-rays obtained bilaterally.  Decreased calcaneal intervention bilaterally.  Arthritic changes present at the first  MPJ.  No evidence of acute fracture. -Given his history we will order an oral anti-inflammatories.  Discussed Voltaren gel topically.  We discussed stretching, icing on a regular basis as well as shoes with good arch support.  I also report an orthotic prescription with good arch support as well as Morton's extension. Trula Slade DPM

## 2022-06-02 NOTE — Patient Instructions (Signed)

## 2022-12-10 ENCOUNTER — Ambulatory Visit
Admission: EM | Admit: 2022-12-10 | Discharge: 2022-12-10 | Disposition: A | Discharge status: Home / Self Care | Payer: BC Managed Care – PPO | Attending: Internal Medicine | Admitting: Internal Medicine

## 2022-12-10 DIAGNOSIS — J069 Acute upper respiratory infection, unspecified: Secondary | ICD-10-CM

## 2022-12-10 MED ORDER — FLUTICASONE PROPIONATE 50 MCG/ACT NA SUSP: 1.0000 | Freq: Every day | NASAL | 0 refills | Status: DC

## 2022-12-10 MED ORDER — BENZONATATE 100 MG PO CAPS: 100.0000 mg | ORAL_CAPSULE | Freq: Three times a day (TID) | ORAL | 0 refills | Status: DC | PRN

## 2022-12-10 NOTE — Discharge Instructions (Signed)
Suspect that you have a viral illness that should run its course as we discussed.  I have prescribed you 2 medications to alleviate the symptoms.  Please follow-up if any symptoms persist or worsen.

## 2022-12-10 NOTE — ED Provider Notes (Signed)
[facility]    CSN: [id number] Arrival date & time: 12/10/22  1203      History   Chief Complaint No chief complaint on file.   HPI Sean Haynes is a 57 y.o. male.   Patient presents with 4-day history of nasal congestion, cough, chills, sweats.  Denies any known sick contacts or fever.  Reports he took a COVID test at home that was negative.  Has taken DayQuil and NyQuil for symptoms.  Denies chest pain or shortness of breath.  Denies history of asthma or COPD and patient denies that he smokes cigarettes.     Past Medical History:  Diagnosis Date   Agoraphobia with panic disorder    [facility]   Allergic rhinitis    per [facility] referral   Esophageal reflux    per [facility] referral   Hyperlipidemia    Hypertension    Impotence of organic origin    per [facility] referral   Insomnia    per [facility] referral   Iron deficiency anemia    Post traumatic stress disorder    per [facility] referral   Sleep apnea     Patient Active Problem List   Diagnosis Date Noted   Hx of adenomatous colonic polyps 09/24/2021   Positive fecal occult blood test 05/18/2021   Absolute anemia 05/18/2021   AVM (arteriovenous malformation) of small bowel, acquired 05/18/2021   Gastroesophageal reflux disease without esophagitis 05/18/2021   Symptomatic anemia 09/26/2019   GI bleed 09/26/2019   Nonspecific abnormal electrocardiogram (ECG) (EKG) 12/28/2010   Hypertension 12/28/2010   Tobacco abuse 12/28/2010    Past Surgical History:  Procedure Laterality Date   COLONOSCOPY     ESOPHAGOGASTRODUODENOSCOPY (EGD) WITH PROPOFOL N/A 09/27/2019   Procedure: ESOPHAGOGASTRODUODENOSCOPY (EGD) WITH PROPOFOL;  Surgeon: Charlott Rakes, MD;  Location: [facility] ENDOSCOPY;  Service: Endoscopy;  Laterality: N/A;   GIVENS CAPSULE STUDY N/A 09/27/2019   Procedure: GIVENS CAPSULE STUDY;  Surgeon: Charlott Rakes, MD;  Location: [facility] ENDOSCOPY;  Service: Endoscopy;  Laterality: N/A;   MEDIAL COLLATERAL LIGAMENT AND  LATERAL COLLATERAL LIGAMENT REPAIR, KNEE  03/21/2002   right   UPPER GASTROINTESTINAL ENDOSCOPY         Home Medications    Prior to Admission medications   Medication Sig Start Date End Date Taking? Authorizing Provider  benzonatate (TESSALON) 100 MG capsule Take 1 capsule (100 mg total) by mouth every 8 (eight) hours as needed for cough. 12/10/22  Yes Louvina Cleary, Rolly Salter E, FNP  fluticasone (FLONASE) 50 MCG/ACT nasal spray Place 1 spray into both nostrils daily. 12/10/22  Yes Reginaldo Hazard, Rolly Salter E, FNP  amLODipine (NORVASC) 5 MG tablet Take 5 mg by mouth daily.    [provider]  chlorthalidone (HYGROTON) 25 MG tablet Take 12.5 mg by mouth daily.     [provider]  citalopram (CELEXA) 40 MG tablet TAKE ONE TABLET BY MOUTH EVERY MORNING AFTER BREAKFAST FOR MENTAL HEALTH 05/19/21   [provider]  diclofenac Sodium (VOLTAREN) 1 % GEL Apply 2 g topically 4 (four) times daily. Rub into affected area of foot 2 to 4 times daily 06/02/22   Vivi Barrack, DPM  ferrous sulfate 325 (65 FE) MG EC tablet Take 1 tablet (325 mg total) by mouth daily. 09/23/21 09/23/22  Mansouraty, Netty Starring., MD  losartan (COZAAR) 100 MG tablet Take 100 mg by mouth daily.  08/01/19   [provider]  melatonin 3 MG TABS tablet Take 6 mg by mouth at bedtime  as needed (sleep.).    [provider]  pantoprazole (PROTONIX) 40 MG tablet Take 40 mg by mouth daily. 08/01/19   [provider]  traZODone (DESYREL) 100 MG tablet Take 100 mg by mouth at bedtime as needed for sleep (insomnia).    [provider]    Family History Family History  Problem Relation Age of Onset   Anemia Mother    Arrhythmia Mother    Diabetes Mother    Hyperlipidemia Mother    Hypertension Mother    Diabetes Father    Hyperlipidemia Father    Hypertension Father    Stroke Father    Arrhythmia Sister    Hypertension Sister    Hypertension Sister    Arrhythmia Brother    Hypertension  Brother    Hypertension Brother    Colon cancer Neg Hx    Esophageal cancer Neg Hx    Liver disease Neg Hx    Inflammatory bowel disease Neg Hx    Pancreatic cancer Neg Hx    Stomach cancer Neg Hx    Rectal cancer Neg Hx     Social History Social History   Tobacco Use   Smoking status: Former    Current packs/day: 2.00    Average packs/day: 2.0 packs/day for 34.0 years (68.0 ttl pk-yrs)    Types: Cigarettes   Smokeless tobacco: Never  Vaping Use   Vaping status: Never Used  Substance Use Topics   Alcohol use: Yes    Comment: 6 pack of beer and half a fifth per week   Drug use: No     Allergies   Patient has no known allergies.   Review of Systems Review of Systems Per HPI  Physical Exam Triage Vital Signs ED Triage Vitals  Encounter Vitals Group     BP 12/10/22 1225 (!) 147/92     Systolic BP Percentile --      Diastolic BP Percentile --      Pulse Rate 12/10/22 1225 76     Resp --      Temp 12/10/22 1225 98.8 F (37.1 C)     Temp Source 12/10/22 1225 Oral     SpO2 12/10/22 1225 94 %     Weight 12/10/22 1227 285 lb (129.3 kg)     Height 12/10/22 1227 6\' 4"  (1.93 m)     Head Circumference --      Peak Flow --      Pain Score 12/10/22 1226 5     Pain Loc --      Pain Education --      Exclude from Growth Chart --    No data found.  Updated Vital Signs BP (!) 147/92 (BP Location: Left Arm)   Pulse 76   Temp 98.8 F (37.1 C) (Oral)   Ht 6\' 4"  (1.93 m)   Wt 285 lb (129.3 kg)   SpO2 94%   BMI 34.69 kg/m   Visual Acuity Right Eye Distance:   Left Eye Distance:   Bilateral Distance:    Right Eye Near:   Left Eye Near:    Bilateral Near:     Physical Exam Constitutional:      General: He is not in acute distress.    Appearance: Normal appearance. He is not toxic-appearing or diaphoretic.  HENT:     Head: Normocephalic and atraumatic.     Right Ear: Tympanic membrane and ear canal normal.     Left Ear: Tympanic membrane and ear canal  normal.     Nose: Congestion present.     Mouth/Throat:     Mouth: Mucous membranes are moist.     Pharynx: No posterior oropharyngeal erythema.  Eyes:     Extraocular Movements: Extraocular movements intact.     Conjunctiva/sclera: Conjunctivae normal.     Pupils: Pupils are equal, round, and reactive to light.  Cardiovascular:     Rate and Rhythm: Normal rate and regular rhythm.     Pulses: Normal pulses.     Heart sounds: Normal heart sounds.  Pulmonary:     Effort: Pulmonary effort is normal. No respiratory distress.     Breath sounds: Normal breath sounds. No stridor. No wheezing, rhonchi or rales.  Abdominal:     General: Abdomen is flat. Bowel sounds are normal.     Palpations: Abdomen is soft.  Musculoskeletal:        General: Normal range of motion.     Cervical back: Normal range of motion.  Skin:    General: Skin is warm and dry.  Neurological:     General: No focal deficit present.     Mental Status: He is alert and oriented to person, place, and time. Mental status is at baseline.  Psychiatric:        Mood and Affect: Mood normal.        Behavior: Behavior normal.      UC Treatments / Results  Labs (all labs ordered are listed, but only abnormal results are displayed) Labs Reviewed - No data to display  EKG   Radiology No results found.  Procedures Procedures (including critical care time)  Medications Ordered in UC Medications - No data to display  Initial Impression / Assessment and Plan / UC Course  I have reviewed the triage vital signs and the nursing notes.  Pertinent labs & imaging results that were available during my care of the patient were reviewed by me and considered in my medical decision making (see chart for details).     Patient presents with symptoms likely from a viral upper respiratory infection.  Do not suspect underlying cardiopulmonary process. Symptoms seem unlikely related to ACS, CHF or COPD exacerbations, pneumonia,  pneumothorax. Patient is nontoxic appearing and not in need of emergent medical intervention.  COVID testing deferred given patient had negative COVID test at home.  Recommended symptom control with medications and supportive care.  Patient was sent Flonase and benzonatate as this is safe with blood pressure.  Return if symptoms fail to improve in 1-2 weeks or you develop shortness of breath, chest pain, severe headache. Patient states understanding and is agreeable.  Discharged with PCP followup.  Final Clinical Impressions(s) / UC Diagnoses   Final diagnoses:  Viral upper respiratory tract infection with cough     Discharge Instructions      Suspect that you have a viral illness that should run its course as we discussed.  I have prescribed you 2 medications to alleviate the symptoms.  Please follow-up if any symptoms persist or worsen.    ED Prescriptions     Medication Sig Dispense Auth. Provider   fluticasone (FLONASE) 50 MCG/ACT nasal spray Place 1 spray into both nostrils daily. 16 g Nahome Bublitz, Rolly Salter E, [place]   benzonatate (TESSALON) 100 MG capsule Take 1 capsule (100 mg total) by mouth every 8 (eight) hours as needed for cough. 21 capsule Spreckels, Acie Fredrickson, [place]      PDMP not reviewed this encounter.   Gustavus Bryant, [place]  12/10/22 1310  

## 2022-12-10 NOTE — ED Triage Notes (Signed)
Patient presents with l"ingering cold for the last 4 days." Patient states it is worst at night. Cough, congestion, hot/cold chills. Treated with Nyquil and Dayquil. Took at home covid test, negative results.

## 2022-12-17 ENCOUNTER — Ambulatory Visit: Payer: BC Managed Care – PPO

## 2022-12-17 ENCOUNTER — Ambulatory Visit
Admission: EM | Admit: 2022-12-17 | Discharge: 2022-12-17 | Disposition: A | Discharge status: Home / Self Care | Payer: BC Managed Care – PPO

## 2022-12-17 DIAGNOSIS — E785 Hyperlipidemia, unspecified: Secondary | ICD-10-CM | POA: Insufficient documentation

## 2022-12-17 DIAGNOSIS — Z4889 Encounter for other specified surgical aftercare: Secondary | ICD-10-CM | POA: Insufficient documentation

## 2022-12-17 DIAGNOSIS — F419 Anxiety disorder, unspecified: Secondary | ICD-10-CM | POA: Insufficient documentation

## 2022-12-17 DIAGNOSIS — Z7189 Other specified counseling: Secondary | ICD-10-CM | POA: Insufficient documentation

## 2022-12-17 DIAGNOSIS — Z9182 Personal history of military deployment: Secondary | ICD-10-CM | POA: Insufficient documentation

## 2022-12-17 DIAGNOSIS — F4001 Agoraphobia with panic disorder: Secondary | ICD-10-CM | POA: Insufficient documentation

## 2022-12-17 DIAGNOSIS — G4700 Insomnia, unspecified: Secondary | ICD-10-CM | POA: Insufficient documentation

## 2022-12-17 DIAGNOSIS — R918 Other nonspecific abnormal finding of lung field: Secondary | ICD-10-CM | POA: Insufficient documentation

## 2022-12-17 DIAGNOSIS — R059 Cough, unspecified: Secondary | ICD-10-CM

## 2022-12-17 DIAGNOSIS — I10 Essential (primary) hypertension: Secondary | ICD-10-CM | POA: Insufficient documentation

## 2022-12-17 DIAGNOSIS — K219 Gastro-esophageal reflux disease without esophagitis: Secondary | ICD-10-CM | POA: Insufficient documentation

## 2022-12-17 DIAGNOSIS — M2020 Hallux rigidus, unspecified foot: Secondary | ICD-10-CM | POA: Insufficient documentation

## 2022-12-17 DIAGNOSIS — Z01818 Encounter for other preprocedural examination: Secondary | ICD-10-CM | POA: Insufficient documentation

## 2022-12-17 DIAGNOSIS — E291 Testicular hypofunction: Secondary | ICD-10-CM | POA: Insufficient documentation

## 2022-12-17 DIAGNOSIS — D509 Iron deficiency anemia, unspecified: Secondary | ICD-10-CM | POA: Insufficient documentation

## 2022-12-17 DIAGNOSIS — E669 Obesity, unspecified: Secondary | ICD-10-CM | POA: Insufficient documentation

## 2022-12-17 DIAGNOSIS — M19071 Primary osteoarthritis, right ankle and foot: Secondary | ICD-10-CM | POA: Insufficient documentation

## 2022-12-17 DIAGNOSIS — K08409 Partial loss of teeth, unspecified cause, unspecified class: Secondary | ICD-10-CM | POA: Insufficient documentation

## 2022-12-17 DIAGNOSIS — J189 Pneumonia, unspecified organism: Secondary | ICD-10-CM | POA: Diagnosis not present

## 2022-12-17 DIAGNOSIS — H526 Other disorders of refraction: Secondary | ICD-10-CM | POA: Insufficient documentation

## 2022-12-17 DIAGNOSIS — F4312 Post-traumatic stress disorder, chronic: Secondary | ICD-10-CM | POA: Insufficient documentation

## 2022-12-17 DIAGNOSIS — M79673 Pain in unspecified foot: Secondary | ICD-10-CM | POA: Insufficient documentation

## 2022-12-17 DIAGNOSIS — J309 Allergic rhinitis, unspecified: Secondary | ICD-10-CM | POA: Insufficient documentation

## 2022-12-17 MED ORDER — DOXYCYCLINE HYCLATE 100 MG PO CAPS: 100.0000 mg | ORAL_CAPSULE | Freq: Two times a day (BID) | ORAL | 0 refills | Status: DC

## 2022-12-17 NOTE — ED Triage Notes (Signed)
See 12-13-2022 for URI. Treated with Flonase, Tessalon. "My symptoms haven't gotten better just gotten worse, mainly at night". No fever. No sob. No wheezing. "Symptoms originally started about 2 wks ago". Symptoms that remain as well "productive cough". No runny nose.

## 2022-12-21 NOTE — ED Provider Notes (Signed)
[facility]    CSN: [id number] Arrival date & time: 12/17/22  1032      History   Chief Complaint Chief Complaint  Patient presents with   Follow-up    HPI Sean Haynes is a 57 y.o. male.   Patient here today for evaluation of continued sinus symptoms and cough.  He notes cough is productive.  Symptoms started about 2 weeks ago.  He denies any shortness of breath or wheezing.  He has not had any fever.  He has tried taking Flonase and Lawyer without resolution.  The history is provided by the patient.    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 12/17/2022   Gastro-esophageal reflux disease without esophagitis 12/17/2022   Hyperlipidemia, unspecified 12/17/2022   Hypogonadism in male 12/17/2022   Obesity 12/17/2022   Osteoarthritis of feet, bilateral 12/17/2022   Allergic rhinitis 12/17/2022   Anxiety 12/17/2022   Encounter for other specified surgical aftercare 12/17/2022   Hallux rigidus, unspecified foot 12/17/2022   Acquired hallux rigidus 12/17/2022   Iron deficiency anemia, unspecified 12/17/2022   Iron deficiency anemia 12/17/2022   Middle insomnia 12/17/2022   Multiple nodules of lung 12/17/2022   Other disorders of refraction 12/17/2022   Encounter for other preprocedural examination 12/17/2022   Panic disorder with agoraphobia 12/17/2022   Pain in unspecified foot 12/17/2022   Other specified counseling 12/17/2022   Partial loss of teeth, unspecified cause,  unspecified class 12/17/2022   Post-traumatic stress disorder, chronic 12/17/2022   Essential (primary) hypertension 12/17/2022   Hx of adenomatous colonic polyps 09/24/2021   Positive fecal occult blood test 05/18/2021   Absolute anemia 05/18/2021   AVM (arteriovenous malformation) of small bowel, acquired 05/18/2021   Gastroesophageal reflux disease without esophagitis 05/18/2021   Symptomatic anemia 09/26/2019   GI bleed 09/26/2019   Nonspecific abnormal electrocardiogram (ECG) (EKG) 12/28/2010   Hypertension 12/28/2010   Tobacco use disorder 12/28/2010    Past Surgical History:  Procedure Laterality Date   COLONOSCOPY     ESOPHAGOGASTRODUODENOSCOPY (EGD) WITH PROPOFOL N/A 09/27/2019   Procedure: ESOPHAGOGASTRODUODENOSCOPY (EGD) WITH PROPOFOL;  Surgeon: Charlott Rakes, MD;  Location: [facility] ENDOSCOPY;  Service: Endoscopy;  Laterality: N/A;   GIVENS CAPSULE STUDY N/A 09/27/2019   Procedure: GIVENS CAPSULE STUDY;  Surgeon: Charlott Rakes, MD;  Location: [facility] ENDOSCOPY;  Service: Endoscopy;  Laterality: N/A;   MEDIAL COLLATERAL LIGAMENT AND LATERAL COLLATERAL LIGAMENT REPAIR, KNEE  03/21/2002   right   UPPER GASTROINTESTINAL ENDOSCOPY         Home Medications    Prior to Admission medications   Medication Sig Start Date End Date Taking? Authorizing Provider  amLODipine (NORVASC) 5 MG tablet Take 5 mg by mouth daily.   Yes [provider]  atorvastatin (LIPITOR) 20 MG tablet Take 20 mg by mouth daily. 09/22/22  Yes [provider]  benzonatate (TESSALON) 100 MG capsule  Take 1 capsule (100 mg total) by mouth every 8 (eight) hours as needed for cough. 12/10/22  Yes Mound, Rolly Salter E, FNP  fluticasone (FLONASE) 50 MCG/ACT nasal spray Place 1 spray into both nostrils daily. 12/10/22  Yes Mound, Rolly Salter E, FNP  losartan (COZAAR) 100 MG tablet Take 100 mg by mouth daily.  08/01/19  Yes [provider]  MELATONIN PO Take 3 mg by mouth daily at 6 (six) AM. 07/27/22  Yes  [provider]  pantoprazole (PROTONIX) 40 MG tablet Take 40 mg by mouth daily. 08/01/19  Yes [provider]  sildenafil (VIAGRA) 100 MG tablet Take 100 mg by mouth as needed for erectile dysfunction. 07/27/22  Yes [provider]  traZODone (DESYREL) 100 MG tablet Take 100 mg by mouth at bedtime as needed for sleep (insomnia).   Yes [provider]  chlorthalidone (HYGROTON) 25 MG tablet Take 12.5 mg by mouth daily.     [provider]  citalopram (CELEXA) 40 MG tablet TAKE ONE TABLET BY MOUTH EVERY MORNING AFTER BREAKFAST FOR MENTAL HEALTH 05/19/21   [provider]  diclofenac Sodium (VOLTAREN) 1 % GEL Apply 2 g topically 4 (four) times daily. Rub into affected area of foot 2 to 4 times daily 06/02/22   Vivi Barrack, DPM  doxycycline (VIBRAMYCIN) 100 MG capsule Take 1 capsule (100 mg total) by mouth 2 (two) times daily. 12/17/22   Tomi Bamberger, PA-C  HYDROcodone-acetaminophen (NORCO/VICODIN) 5-325 MG tablet Take 1 tablet by mouth every 4 (four) hours as needed for moderate pain or severe pain.    [provider]  ibuprofen (ADVIL) 800 MG tablet Take 800 mg by mouth every 6 (six) hours as needed for fever, headache, mild pain or moderate pain.    [provider]  melatonin 3 MG TABS tablet Take 6 mg by mouth at bedtime as needed (sleep.).    [provider]    Family History Family History  Problem Relation Age of Onset   Anemia Mother    Arrhythmia Mother    Diabetes Mother    Hyperlipidemia Mother    Hypertension Mother    Diabetes Father    Hyperlipidemia Father    Hypertension Father    Stroke Father    Arrhythmia Sister    Hypertension Sister    Hypertension Sister    Arrhythmia Brother    Hypertension Brother    Hypertension Brother    Colon cancer Neg Hx    Esophageal cancer Neg Hx    Liver disease Neg Hx    Inflammatory bowel disease Neg Hx    Pancreatic cancer Neg Hx    Stomach cancer  Neg Hx    Rectal cancer Neg Hx     Social History Social History   Tobacco Use   Smoking status: Former    Current packs/day: 2.00    Average packs/day: 2.0 packs/day for 34.0 years (68.0 ttl pk-yrs)    Types: Cigarettes   Smokeless tobacco: Never  Vaping Use   Vaping status: Never Used  Substance Use Topics   Alcohol use: Not Currently    Comment: 6 pack of beer and half a fifth per week   Drug use: No     Allergies   Patient has no known allergies.   Review of Systems Review of Systems  Constitutional:  Positive for chills. Negative for fever.  HENT:  Positive for congestion and sore throat. Negative for ear pain.   Eyes:  Negative for  discharge and redness.  Respiratory:  Positive for cough. Negative for shortness of breath.   Gastrointestinal:  Negative for abdominal pain, nausea and vomiting.     Physical Exam Triage Vital Signs ED Triage Vitals  Encounter Vitals Group     BP 12/17/22 1040 (!) 148/88     Systolic BP Percentile --      Diastolic BP Percentile --      Pulse Rate 12/17/22 1040 80     Resp 12/17/22 1040 20     Temp 12/17/22 1040 98.1 F (36.7 C)     Temp Source 12/17/22 1040 Oral     SpO2 12/17/22 1040 98 %     Weight 12/17/22 1042 285 lb (129.3 kg)     Height 12/17/22 1042 6\' 4"  (1.93 m)     Head Circumference --      Peak Flow --      Pain Score 12/17/22 1036 0     Pain Loc --      Pain Education --      Exclude from Growth Chart --    No data found.  Updated Vital Signs BP 130/79 (BP Location: Left Arm)   Pulse 81   Temp 98.1 F (36.7 C) (Oral)   Resp 20   Ht 6\' 4"  (1.93 m)   Wt 285 lb (129.3 kg)   SpO2 98%   BMI 34.69 kg/m      Physical Exam Vitals and nursing note reviewed.  Constitutional:      General: He is not in acute distress.    Appearance: Normal appearance. He is not ill-appearing.  HENT:     Head: Normocephalic and atraumatic.     Right Ear: Tympanic membrane normal.     Left Ear: Tympanic membrane  normal.     Nose: Nose normal. No congestion.     Mouth/Throat:     Mouth: Mucous membranes are moist.     Pharynx: Oropharynx is clear. No oropharyngeal exudate or posterior oropharyngeal erythema.  Eyes:     Conjunctiva/sclera: Conjunctivae normal.  Cardiovascular:     Rate and Rhythm: Normal rate and regular rhythm.     Heart sounds: Normal heart sounds. No murmur heard. Pulmonary:     Effort: Pulmonary effort is normal. No respiratory distress.     Breath sounds: Normal breath sounds. No wheezing, rhonchi or rales.  Skin:    General: Skin is warm and dry.  Neurological:     Mental Status: He is alert.  Psychiatric:        Mood and Affect: Mood normal.        Thought Content: Thought content normal.      UC Treatments / Results  Labs (all labs ordered are listed, but only abnormal results are displayed) Labs Reviewed - No data to display  EKG   Radiology No results found.  Procedures Procedures (including critical care time)  Medications Ordered in UC Medications - No data to display  Initial Impression / Assessment and Plan / UC Course  I have reviewed the triage vital signs and the nursing notes.  Pertinent labs & imaging results that were available during my care of the patient were reviewed by me and considered in my medical decision making (see chart for details).    Given duration of symptoms we will treat to cover sinusitis as well as possible pneumonia noted on x-ray (normal per radiology).  Recommended follow-up if no gradual improvement or with any further concerns.  Final Clinical  Impressions(s) / UC Diagnoses   Final diagnoses:  Pneumonia of right lower lobe due to infectious organism   Discharge Instructions   None    ED Prescriptions     Medication Sig Dispense Auth. Provider   doxycycline (VIBRAMYCIN) 100 MG capsule  (Status: Discontinued) Take 1 capsule (100 mg total) by mouth 2 (two) times daily. 20 capsule Erma Pinto F, PA-C    doxycycline (VIBRAMYCIN) 100 MG capsule Take 1 capsule (100 mg total) by mouth 2 (two) times daily. 20 capsule Tomi Bamberger, PA-C      PDMP not reviewed this encounter.   Tomi Bamberger, PA-C 12/21/22 [phone]

## 2023-12-20 ENCOUNTER — Encounter (HOSPITAL_BASED_OUTPATIENT_CLINIC_OR_DEPARTMENT_OTHER): Payer: Self-pay | Admitting: Orthopaedic Surgery

## 2023-12-20 ENCOUNTER — Other Ambulatory Visit: Payer: Self-pay

## 2023-12-21 ENCOUNTER — Encounter (HOSPITAL_BASED_OUTPATIENT_CLINIC_OR_DEPARTMENT_OTHER)
Admission: RE | Admit: 2023-12-21 | Discharge: 2023-12-21 | Disposition: A | Discharge status: Home / Self Care | Source: Ambulatory Visit | Attending: Orthopaedic Surgery | Admitting: Orthopaedic Surgery

## 2023-12-21 DIAGNOSIS — I10 Essential (primary) hypertension: Secondary | ICD-10-CM | POA: Diagnosis not present

## 2023-12-21 DIAGNOSIS — Z01818 Encounter for other preprocedural examination: Secondary | ICD-10-CM | POA: Insufficient documentation

## 2023-12-21 LAB — BASIC METABOLIC PANEL WITH GFR
Anion gap: 15 (ref 5–15)
BUN: 12 mg/dL (ref 6–20)
CO2: 25 mmol/L (ref 22–32)
Calcium: 9.3 mg/dL (ref 8.9–10.3)
Chloride: 99 mmol/L (ref 98–111)
Creatinine, Ser: 1.19 mg/dL (ref 0.61–1.24)
GFR, Estimated: >60 mL/min (ref >60)
Glucose, Bld: 153 mg/dL — ABNORMAL HIGH (ref 70–99)
Potassium: 3.1 mmol/L — ABNORMAL LOW (ref 3.5–5.1)
Sodium: 139 mmol/L (ref 135–145)

## 2023-12-21 NOTE — Discharge Instructions (Addendum)
 Lillia Mountain, MD [facility]  Please read the following information regarding your care after surgery.  Medications  You only need a prescription for the narcotic pain medicine (ex. oxycodone, Percocet, Norco).  All of the other medicines listed below are available over the counter. ? acetaminophen (Tylenol) 500 mg every 4-6 hours as you need for minor to moderate pain  ? To help prevent blood clots, take aspirin (81 mg) twice daily for at least 42 days after surgery.  You should also get up every hour while you are awake to move around.  Weight Bearing ? Do NOT bear any weight on the operated leg or foot. This means do NOT touch your surgical leg to the ground!  Cast / Splint / Dressing ? If you have a splint, do NOT remove this. Keep your splint, cast or dressing clean and dry.  Don't put anything (coat hanger, pencil, etc) down inside of it.  If it gets wet, call the office immediately to schedule an appointment for a cast change.  Swelling IMPORTANT: It is normal for you to have swelling where you had surgery. To reduce swelling and pain, keep at least 3 pillows under your leg so that your toes are above your nose and your heel is above the level of your hip.  It may be necessary to keep your foot or leg elevated for several weeks.  This is critical to helping your incisions heal and your pain to feel better.  Follow Up Call my office at [phone] when you are discharged from the hospital or surgery center to schedule an appointment to be seen 7-10 days after surgery.  Call my office at [phone] if you develop a fever >101.5 F, nausea, vomiting, bleeding from the surgical site or severe pain.    No tylenol before 3pm.  Regional Anesthesia Blocks  1. You may not be able to move or feel the blocked extremity after a regional anesthetic block. This may last may last from 3-48 hours after placement, but it will go away. The length of time depends on the medication injected  and your individual response to the medication. As the nerves start to wake up, you may experience tingling as the movement and feeling returns to your extremity. If the numbness and inability to move your extremity has not gone away after 48 hours, please call your surgeon.   2. The extremity that is blocked will need to be protected until the numbness is gone and the strength has returned. Because you cannot feel it, you will need to take extra care to avoid injury. Because it may be weak, you may have difficulty moving it or using it. You may not know what position it is in without looking at it while the block is in effect.  3. For blocks in the legs and feet, returning to weight bearing and walking needs to be done carefully. You will need to wait until the numbness is entirely gone and the strength has returned. You should be able to move your leg and foot normally before you try and bear weight or walk. You will need someone to be with you when you first try to ensure you do not fall and possibly risk injury.  4. Bruising and tenderness at the needle site are common side effects and will resolve in a few days.  5. Persistent numbness or new problems with movement should be communicated to the surgeon or the [facility] [phone] [facility] (  [phone]).   Post Anesthesia Home Care Instructions  Activity: Get plenty of rest for the remainder of the day. A responsible individual must stay with you for 24 hours following the procedure.  For the next 24 hours, DO NOT: -Drive a car -Advertising copywriter -Drink alcoholic beverages -Take any medication unless instructed by your physician -Make any legal decisions or sign important papers.  Meals: Start with liquid foods such as gelatin or soup. Progress to regular foods as tolerated. Avoid greasy, spicy, heavy foods. If nausea and/or vomiting occur, drink only clear liquids until the nausea and/or vomiting  subsides. Call your physician if vomiting continues.  Special Instructions/Symptoms: Your throat may feel dry or sore from the anesthesia or the breathing tube placed in your throat during surgery. If this causes discomfort, gargle with warm salt water. The discomfort should disappear within 24 hours.  If you had a scopolamine patch placed behind your ear for the management of post- operative nausea and/or vomiting:  1. The medication in the patch is effective for 72 hours, after which it should be removed.  Wrap patch in a tissue and discard in the trash. Wash hands thoroughly with soap and water. 2. You may remove the patch earlier than 72 hours if you experience unpleasant side effects which may include dry mouth, dizziness or visual disturbances. 3. Avoid touching the patch. Wash your hands with soap and water after contact with the patch.

## 2023-12-21 NOTE — H&P (Signed)
 ORTHOPAEDIC SURGERY H&P  Subjective:  The patient presents for LEFT midfoot fusion (1st & 2nd TMT joints) with possible allograft, possible gastrocnemius recession and/or tendoachilles lengthening.   Past Medical History:  Diagnosis Date   Agoraphobia with panic disorder    [facility]   Allergic rhinitis    per [facility] referral   Arthritis    bil feet   Esophageal reflux    per [facility] referral   Hyperlipidemia    Hypertension    Impotence of organic origin    per [facility] referral   Insomnia    per [facility] referral   Iron deficiency anemia    Post traumatic stress disorder    per [facility] referral   Sleep apnea    no CPAP    Past Surgical History:  Procedure Laterality Date   ANTERIOR CRUCIATE LIGAMENT REPAIR Left    COLONOSCOPY     ESOPHAGOGASTRODUODENOSCOPY (EGD) WITH PROPOFOL  N/A 09/27/2019   Procedure: ESOPHAGOGASTRODUODENOSCOPY (EGD) WITH PROPOFOL ;  Surgeon: Dianna Specking, MD;  Location: [facility] ENDOSCOPY;  Service: Endoscopy;  Laterality: N/A;   FOOT SURGERY Bilateral    GIVENS CAPSULE STUDY N/A 09/27/2019   Procedure: GIVENS CAPSULE STUDY;  Surgeon: Dianna Specking, MD;  Location: [facility] ENDOSCOPY;  Service: Endoscopy;  Laterality: N/A;   MEDIAL COLLATERAL LIGAMENT AND LATERAL COLLATERAL LIGAMENT REPAIR, KNEE  03/21/2002   right   UPPER GASTROINTESTINAL ENDOSCOPY       (Not in an outpatient encounter)    No Known Allergies  Social History   Socioeconomic History   Marital status: Married    Spouse name: Not on file   Number of children: 2   Years of education: Not on file   Highest education level: Not on file  Occupational History    Employer: [employer]  Tobacco Use   Smoking status: Some Days    Types: Cigars   Smokeless tobacco: Never  Vaping Use   Vaping status: Never Used  Substance and Sexual Activity   Alcohol use: Yes    Comment: 2-3 beers /day   Drug use: No   Sexual activity: Not Currently  Other Topics Concern   Not on file  Social History  Narrative   Not on file   Social Drivers of Health   Financial Resource Strain: Not on file  Food Insecurity: Not on file  Transportation Needs: Not on file  Physical Activity: Not on file  Stress: Not on file  Social Connections: Unknown (08/01/2021)   Received from [facility]   Social Network    Social Network: Not on file  Intimate Partner Violence: Unknown (06/23/2021)   Received from [facility]   HITS    Physically Hurt: Not on file    Insult or Talk Down To: Not on file    Threaten Physical Harm: Not on file    Scream or Curse: Not on file     History reviewed. No pertinent family history.   Review of Systems Pertinent items are noted in HPI.  Objective: Vital signs in last 24 hours:    12/20/2023   12:12 PM 12/17/2022   10:48 AM 12/17/2022   10:42 AM  Vitals with BMI  Height 6' 4  6' 4  Weight 300 lbs  285 lbs  BMI 36.53  34.71  Systolic  130   Diastolic  79   Pulse  81       EXAM: General: Well nourished, well developed. Awake, alert and oriented to time, place, person. Normal mood  and affect. No apparent distress. Breathing room air.  Operative Lower Extremity: Alignment - Neutral Deformity - None Skin intact Tenderness to palpation - left midfoot 5/5 TA, PT, GS, Per, EHL, FHL Sensation intact to light touch throughout Palpable DP and PT pulses Special testing: None  The contralateral foot/ankle was examined for comparison and noted to be neurovascularly intact with no localized deformity, swelling, or tenderness.  Imaging Review All images taken were independently reviewed by me.  Assessment/Plan: The clinical and radiographic findings were reviewed and discussed at length with the patient.  The patient presents for LEFT midfoot fusion (1st & 2nd TMT joints) with possible allograft, possible gastrocnemius recession and/or tendoachilles lengthening.  We spoke at length about the natural course of these findings. We discussed  nonoperative and operative treatment options in detail.  The risks and benefits were presented and reviewed. The risks due to suture/hardware failure/irritation (or if removing hardware inability to remove part/all of hardware, recurrent instability), new/persistent/recurrent infection, stiffness, nerve/vessel/tendon injury, nonunion/malunion of any fracture, wound healing issues, allograft usage, development of arthritis, failure of this surgery, possibility of external fixation in certain situations, possibility of delayed definitive surgery, need for further surgery, prolonged wound care including further soft tissue coverage procedures, thromboembolic events, anesthesia/medical complications/events perioperatively and beyond, amputation, death among others were discussed. The patient acknowledged the explanation and agreed to proceed with the plan.  Lillia Mountain  Orthopaedic Surgery [facility]

## 2023-12-22 ENCOUNTER — Ambulatory Visit
Admission: RE | Admit: 2023-12-22 | Discharge: 2023-12-22 | Disposition: A | Discharge status: Home / Self Care | Source: Ambulatory Visit | Attending: Orthopaedic Surgery | Admitting: Orthopaedic Surgery

## 2023-12-22 ENCOUNTER — Other Ambulatory Visit: Payer: Self-pay | Admitting: Orthopaedic Surgery

## 2023-12-22 ENCOUNTER — Other Ambulatory Visit

## 2023-12-22 DIAGNOSIS — M19071 Primary osteoarthritis, right ankle and foot: Secondary | ICD-10-CM

## 2023-12-22 DIAGNOSIS — M19072 Primary osteoarthritis, left ankle and foot: Secondary | ICD-10-CM

## 2023-12-27 ENCOUNTER — Ambulatory Visit (HOSPITAL_BASED_OUTPATIENT_CLINIC_OR_DEPARTMENT_OTHER): Admitting: Anesthesiology

## 2023-12-27 ENCOUNTER — Other Ambulatory Visit: Payer: Self-pay

## 2023-12-27 ENCOUNTER — Ambulatory Visit (HOSPITAL_BASED_OUTPATIENT_CLINIC_OR_DEPARTMENT_OTHER)
Admission: RE | Admit: 2023-12-27 | Discharge: 2023-12-27 | Disposition: A | Discharge status: Home / Self Care | Attending: Orthopaedic Surgery | Admitting: Orthopaedic Surgery

## 2023-12-27 ENCOUNTER — Encounter (HOSPITAL_BASED_OUTPATIENT_CLINIC_OR_DEPARTMENT_OTHER)
Admission: RE | Disposition: A | Discharge status: Home / Self Care | Payer: Self-pay | Source: Home / Self Care | Attending: Orthopaedic Surgery

## 2023-12-27 ENCOUNTER — Ambulatory Visit (HOSPITAL_COMMUNITY)

## 2023-12-27 ENCOUNTER — Encounter (HOSPITAL_BASED_OUTPATIENT_CLINIC_OR_DEPARTMENT_OTHER): Payer: Self-pay | Admitting: Orthopaedic Surgery

## 2023-12-27 DIAGNOSIS — F419 Anxiety disorder, unspecified: Secondary | ICD-10-CM

## 2023-12-27 DIAGNOSIS — F17200 Nicotine dependence, unspecified, uncomplicated: Secondary | ICD-10-CM | POA: Diagnosis not present

## 2023-12-27 DIAGNOSIS — M19072 Primary osteoarthritis, left ankle and foot: Secondary | ICD-10-CM | POA: Insufficient documentation

## 2023-12-27 DIAGNOSIS — I10 Essential (primary) hypertension: Secondary | ICD-10-CM | POA: Insufficient documentation

## 2023-12-27 DIAGNOSIS — F17290 Nicotine dependence, other tobacco product, uncomplicated: Secondary | ICD-10-CM | POA: Diagnosis not present

## 2023-12-27 DIAGNOSIS — G4730 Sleep apnea, unspecified: Secondary | ICD-10-CM | POA: Insufficient documentation

## 2023-12-27 DIAGNOSIS — K219 Gastro-esophageal reflux disease without esophagitis: Secondary | ICD-10-CM | POA: Diagnosis not present

## 2023-12-27 HISTORY — DX: Unspecified osteoarthritis, unspecified site: M19.90

## 2023-12-27 HISTORY — PX: FOOT ARTHRODESIS: SHX1655

## 2023-12-27 SURGERY — FUSION, JOINT, FOOT
Anesthesia: General | Site: Foot | Laterality: Left

## 2023-12-27 MED ORDER — FENTANYL CITRATE (PF) 100 MCG/2ML IJ SOLN
INTRAMUSCULAR | Status: DC | PRN
  Administered 2023-12-27: 100 ug via INTRAVENOUS

## 2023-12-27 MED ORDER — FENTANYL CITRATE (PF) 100 MCG/2ML IJ SOLN
INTRAMUSCULAR | Status: AC
  Filled 2023-12-27: qty 2

## 2023-12-27 MED ORDER — DEXMEDETOMIDINE HCL IN NACL 80 MCG/20ML IV SOLN
INTRAVENOUS | Status: AC
  Filled 2023-12-27: qty 20

## 2023-12-27 MED ORDER — LIDOCAINE 2% (20 MG/ML) 5 ML SYRINGE
INTRAMUSCULAR | Status: AC
  Filled 2023-12-27: qty 20

## 2023-12-27 MED ORDER — EPHEDRINE SULFATE (PRESSORS) 50 MG/ML IJ SOLN
INTRAMUSCULAR | Status: DC | PRN
  Administered 2023-12-27 (×3): 5 mg via INTRAVENOUS

## 2023-12-27 MED ORDER — CEFAZOLIN SODIUM-DEXTROSE 2-4 GM/100ML-% IV SOLN
INTRAVENOUS | Status: AC
  Filled 2023-12-27: qty 100

## 2023-12-27 MED ORDER — DEXAMETHASONE SODIUM PHOSPHATE 10 MG/ML IJ SOLN
INTRAMUSCULAR | Status: AC
  Filled 2023-12-27: qty 4

## 2023-12-27 MED ORDER — PROPOFOL 500 MG/50ML IV EMUL
INTRAVENOUS | Status: AC
  Filled 2023-12-27: qty 150

## 2023-12-27 MED ORDER — DEXMEDETOMIDINE HCL IN NACL 80 MCG/20ML IV SOLN
INTRAVENOUS | Status: DC | PRN
  Administered 2023-12-27: 4 ug via INTRAVENOUS

## 2023-12-27 MED ORDER — BUPIVACAINE-EPINEPHRINE (PF) 0.5% -1:200000 IJ SOLN
INTRAMUSCULAR | Status: DC | PRN
  Administered 2023-12-27: 25 mL via PERINEURAL
  Administered 2023-12-27: 15 mL via PERINEURAL

## 2023-12-27 MED ORDER — ACETAMINOPHEN 500 MG PO TABS
ORAL_TABLET | ORAL | Status: AC
  Filled 2023-12-27: qty 2

## 2023-12-27 MED ORDER — CHLORHEXIDINE GLUCONATE 4 % EX SOLN: 60.0000 mL | Freq: Once | CUTANEOUS | Status: DC

## 2023-12-27 MED ORDER — FENTANYL CITRATE (PF) 100 MCG/2ML IJ SOLN
100.0000 ug | Freq: Once | INTRAMUSCULAR | Status: AC
  Administered 2023-12-27: 100 ug via INTRAVENOUS

## 2023-12-27 MED ORDER — POVIDONE-IODINE 10 % EX SOLN
CUTANEOUS | Status: DC | PRN
  Administered 2023-12-27: 1 via TOPICAL

## 2023-12-27 MED ORDER — ONDANSETRON HCL 4 MG/2ML IJ SOLN
INTRAMUSCULAR | Status: AC
  Filled 2023-12-27: qty 8

## 2023-12-27 MED ORDER — SODIUM CHLORIDE 0.9 % IR SOLN
Status: DC | PRN
  Administered 2023-12-27: 1000 mL

## 2023-12-27 MED ORDER — HYDROMORPHONE HCL 1 MG/ML IJ SOLN: 0.2500 mg | INTRAMUSCULAR | Status: DC | PRN

## 2023-12-27 MED ORDER — DEXAMETHASONE SODIUM PHOSPHATE 4 MG/ML IJ SOLN
INTRAMUSCULAR | Status: DC | PRN
  Administered 2023-12-27: 5 mg via INTRAVENOUS

## 2023-12-27 MED ORDER — PROPOFOL 10 MG/ML IV BOLUS
INTRAVENOUS | Status: DC | PRN
  Administered 2023-12-27: 250 mg via INTRAVENOUS

## 2023-12-27 MED ORDER — MIDAZOLAM HCL 2 MG/2ML IJ SOLN
INTRAMUSCULAR | Status: AC
  Filled 2023-12-27: qty 2

## 2023-12-27 MED ORDER — LACTATED RINGERS IV SOLN: INTRAVENOUS | Status: DC

## 2023-12-27 MED ORDER — MIDAZOLAM HCL 2 MG/2ML IJ SOLN
2.0000 mg | Freq: Once | INTRAMUSCULAR | Status: AC
  Administered 2023-12-27: 2 mg via INTRAVENOUS

## 2023-12-27 MED ORDER — CEFAZOLIN SODIUM-DEXTROSE 3-4 GM/150ML-% IV SOLN
3.0000 g | INTRAVENOUS | Status: AC
  Administered 2023-12-27: 3 g via INTRAVENOUS

## 2023-12-27 MED ORDER — DEXAMETHASONE SODIUM PHOSPHATE 4 MG/ML IJ SOLN: INTRAMUSCULAR | Status: DC | PRN

## 2023-12-27 MED ORDER — VANCOMYCIN HCL 500 MG IV SOLR
INTRAVENOUS | Status: DC | PRN
  Administered 2023-12-27: 500 mg via TOPICAL

## 2023-12-27 MED ORDER — ONDANSETRON HCL 4 MG/2ML IJ SOLN
INTRAMUSCULAR | Status: DC | PRN
  Administered 2023-12-27: 4 mg via INTRAVENOUS

## 2023-12-27 MED ORDER — EPHEDRINE 5 MG/ML INJ
INTRAVENOUS | Status: AC
  Filled 2023-12-27: qty 5

## 2023-12-27 MED ORDER — ACETAMINOPHEN 500 MG PO TABS
1000.0000 mg | ORAL_TABLET | Freq: Once | ORAL | Status: AC
  Administered 2023-12-27: 1000 mg via ORAL

## 2023-12-27 MED ORDER — PHENYLEPHRINE 80 MCG/ML (10ML) SYRINGE FOR IV PUSH (FOR BLOOD PRESSURE SUPPORT)
PREFILLED_SYRINGE | INTRAVENOUS | Status: AC
  Filled 2023-12-27: qty 10

## 2023-12-27 MED ORDER — LIDOCAINE HCL (CARDIAC) PF 100 MG/5ML IV SOSY
PREFILLED_SYRINGE | INTRAVENOUS | Status: DC | PRN
  Administered 2023-12-27: 60 mg via INTRAVENOUS

## 2023-12-27 SURGICAL SUPPLY — 54 items
BIT DRILL 2.4 AO COUPLING CANN (BIT) IMPLANT
BLADE AVERAGE 25X9 (BLADE) IMPLANT
BLADE SURG 15 STRL LF DISP TIS (BLADE) ×2 IMPLANT
BNDG COMPR ESMARK 6X3 LF (GAUZE/BANDAGES/DRESSINGS) IMPLANT
BNDG ELASTIC 4INX 5YD STR LF (GAUZE/BANDAGES/DRESSINGS) ×2 IMPLANT
BNDG ELASTIC 6X10 VLCR STRL LF (GAUZE/BANDAGES/DRESSINGS) ×2 IMPLANT
BNDG GAUZE DERMACEA FLUFF 4 (GAUZE/BANDAGES/DRESSINGS) ×2 IMPLANT
BOOT STEPPER DURA LG (SOFTGOODS) IMPLANT
BOOT STEPPER DURA MED (SOFTGOODS) IMPLANT
BOOT STEPPER DURA SM (SOFTGOODS) IMPLANT
BURR OVAL 8 FLU 4.0X13 (MISCELLANEOUS) IMPLANT
CHLORAPREP W/TINT 26 (MISCELLANEOUS) IMPLANT
CUFF TRNQT CYL 34X4.125X (TOURNIQUET CUFF) IMPLANT
DISSECTOR 3.8MM X 13CM (MISCELLANEOUS) IMPLANT
DRAPE EXTREMITY T 121X128X90 (DISPOSABLE) ×2 IMPLANT
DRAPE OEC MINIVIEW 54X84 (DRAPES) IMPLANT
DRAPE U-SHAPE 47X51 STRL (DRAPES) ×2 IMPLANT
DRSG MEPITEL 4X7.2 (GAUZE/BANDAGES/DRESSINGS) ×2 IMPLANT
ELECTRODE REM PT RTRN 9FT ADLT (ELECTROSURGICAL) ×2 IMPLANT
EXCALIBUR 3.8MM X 13CM (MISCELLANEOUS) IMPLANT
GAUZE PAD ABD 8X10 STRL (GAUZE/BANDAGES/DRESSINGS) IMPLANT
GAUZE SPONGE 4X4 12PLY STRL (GAUZE/BANDAGES/DRESSINGS) ×2 IMPLANT
GLOVE BIOGEL PI IND STRL 8 (GLOVE) ×2 IMPLANT
GLOVE SURG SS PI 7.5 STRL IVOR (GLOVE) ×2 IMPLANT
GOWN STRL REUS W/ TWL LRG LVL3 (GOWN DISPOSABLE) ×2 IMPLANT
KIT FIBERTAK DX 1.6 DISP (KITS) IMPLANT
KIT STAPLE ARCUS 16X13 STRL (Staple) IMPLANT
KWIRE TROC 1.25X150 (WIRE) IMPLANT
NDL SUT 6 .5 CRC .975X.05 MAYO (NEEDLE) IMPLANT
NS IRRIG 1000ML POUR BTL (IV SOLUTION) IMPLANT
PACK ARTHROSCOPY DSU (CUSTOM PROCEDURE TRAY) ×2 IMPLANT
PACK BASIN DAY SURGERY FS (CUSTOM PROCEDURE TRAY) ×2 IMPLANT
PENCIL SMOKE EVACUATOR (MISCELLANEOUS) IMPLANT
SANITIZER HAND ALTRA PUMP 550 (MISCELLANEOUS) ×2 IMPLANT
SCREW 4.0 X 20MM (Screw) IMPLANT
SCREW CANN PT 4.0X34 (Screw) ×1 IMPLANT
SCREW CANN PT 4X34 NS (Screw) IMPLANT
SET IRRIG Y TYPE TUR BLADDER L (SET/KITS/TRAYS/PACK) ×2 IMPLANT
SHAVER DISSECTOR 3.0 (BURR) IMPLANT
SHAVER SABRE 2.0 (BURR) IMPLANT
SLEEVE SCD COMPRESS KNEE MED (STOCKING) ×2 IMPLANT
SOL .9 NS 3000ML IRR UROMATIC (IV SOLUTION) ×2 IMPLANT
SPLINT PLASTER CAST FAST 5X30 (CAST SUPPLIES) IMPLANT
SPONGE T-LAP 18X18 ~~LOC~~+RFID (SPONGE) ×2 IMPLANT
STRAP ANKLE FOOT DISTRACTOR (ORTHOPEDIC SUPPLIES) IMPLANT
SUCTION TUBE FRAZIER 10FR DISP (SUCTIONS) ×2 IMPLANT
SUT ETHILON 2 0 FS 18 (SUTURE) ×2 IMPLANT
SUT VIC AB 2-0 CT1 TAPERPNT 27 (SUTURE) IMPLANT
SUT VIC AB 3-0 SH 27X BRD (SUTURE) IMPLANT
SYR BULB EAR ULCER 3OZ GRN STR (SYRINGE) ×2 IMPLANT
TOWEL GREEN STERILE FF (TOWEL DISPOSABLE) ×2 IMPLANT
TUBE CONNECTING 20X1/4 (TUBING) IMPLANT
TUBING ARTHROSCOPY IRRIG 16FT (MISCELLANEOUS) ×2 IMPLANT
WAND ABLATOR APOLLO I90 (BUR) IMPLANT

## 2023-12-27 NOTE — H&P (Signed)

## 2023-12-27 NOTE — Op Note (Signed)
 12/27/2023  1:09 PM   PATIENT: Sean Haynes  58 y.o. male  MRN: [id number]   PRE-OPERATIVE DIAGNOSIS:   Osteoarthritis of left midfoot   POST-OPERATIVE DIAGNOSIS:   Osteoarthritis of left midfoot   PROCEDURE: 1] Left 1st TMT joint Lapidus arthrodesis 2] Left 2nd TMT joint arthrodesis   SURGEON:  Lillia Mountain, MD   ASSISTANT: None   ANESTHESIA: General, regional   EBL: Minimal   TOURNIQUET:    Total Tourniquet Time Documented: Thigh (Left) - 75 minutes Total: Thigh (Left) - 75 minutes    COMPLICATIONS: None apparent   DISPOSITION: Extubated, awake and stable to recovery.   INDICATION FOR PROCEDURE: The patient presented with above diagnosis.  We discussed the diagnosis, alternative treatment options, risks and benefits of the above surgical intervention, as well as alternative non-operative treatments. All questions/concerns were addressed and the patient/family demonstrated appropriate understanding of the diagnosis, the procedure, the postoperative course, and overall prognosis. The patient wished to proceed with surgical intervention and signed an informed surgical consent as such, in each others presence prior to surgery.   PROCEDURE IN DETAIL: After preoperative consent was obtained and the correct operative site was identified, the patient was brought to the operating room supine on stretcher and transferred onto operating table. General anesthesia was induced. Preoperative antibiotics were administered. Surgical timeout was taken. The patient was then positioned supine with an ipsilateral hip bump. The operative lower extremity was prepped and draped in standard sterile fashion with a tourniquet around the thigh. The extremity was exsanguinated and the tourniquet was inflated to 275 mmHg.  We began by making a longitudinal dorsomedial incision over the 1st TMT joint. Dissection was carried down to the level of the joint taking care to protect the  EHL tendon. Capsulotomy was performed. The joint surfaces were denuded using a combination of saw blade, curettes, rongeurs and drill bit. A partially threaded 4.0 mm Biomet cannulated screw was used to compress across the fusion site. This was then further reinforced with a 16 mm Biomet staple.  We then made a similar longitudinal incision centered over the 2nd webspace proximally. The 2nd TMT was approached and capsulotomy was performed. The joint surfaces were denuded using a combination of saw blade, curettes, rongeurs and drill bit. A partially threaded 4.0 mm Biomet cannulated screw was used to compress across the fusion site.   The surgical sites were thoroughly irrigated. The tourniquet was deflated and hemostasis achieved. Betadine and vancomycin powder were applied. The deep layers were closed using 2-0 vicryl. The skin was closed without tension.    The leg was cleaned with saline and sterile dressings with gauze were applied. A well padded bulky short leg splint was applied. The patient was awakened from anesthesia and transported to the recovery room in stable condition.    FOLLOW UP PLAN: -transfer to PACU, then home -strict NWB operative extremity, maximum elevation -maintain short leg splint until follow up -DVT ppx: Aspirin 81 mg twice daily while NWB -follow up as outpatient within 7-10 days for wound check with exchange of short leg splint to short leg cast -sutures out in 2-3 weeks in outpatient office   RADIOGRAPHS: AP, lateral, oblique and stress radiographs of the left foot were obtained intraoperatively. These showed interval reduction and fusion of the midfoot. Manual stress radiographs were taken and the joints were noted to be stable following fusion. All hardware is appropriately positioned and of the appropriate lengths. No other acute injuries are noted.   Lillia  [facility] Orthopaedic Surgery [facility]

## 2023-12-27 NOTE — Anesthesia Procedure Notes (Signed)
 Procedure Name: LMA Insertion Date/Time: 12/27/2023 10:40 AM  Performed by: Donnell Berwyn SQUIBB, CRNAPre-anesthesia Checklist: Patient identified, Emergency Drugs available, Suction available, Patient being monitored and Timeout performed Patient Re-evaluated:Patient Re-evaluated prior to induction Oxygen Delivery Method: Circle system utilized Preoxygenation: Pre-oxygenation with 100% oxygen LMA: LMA with gastric port inserted LMA Size: 5.0 Number of attempts: 1 Placement Confirmation: positive ETCO2 and breath sounds checked- equal and bilateral Tube secured with: Tape Dental Injury: Teeth and Oropharynx as per pre-operative assessment  Comments: Replaced due to obstructing with regular LMA;good seal obtained easily

## 2023-12-27 NOTE — Transfer of Care (Signed)
 Immediate Anesthesia Transfer of Care Note  Patient: Sean Haynes Null  Procedure(s) Performed: FUSION, JOINT, FOOT (Left: Foot)  Patient Location: PACU  Anesthesia Type:General  Level of Consciousness: awake, alert , and patient cooperative  Airway & Oxygen Therapy: Patient Spontanous Breathing and Patient connected to nasal cannula oxygen  Post-op Assessment: Report given to RN and Post -op Vital signs reviewed and stable  Post vital signs: Reviewed and stable  Last Vitals:  Vitals Value Taken Time  BP    Temp    Pulse 74 12/27/23 12:30  Resp 13 12/27/23 12:30  SpO2 96 % 12/27/23 12:30  Vitals shown include unfiled device data.  Last Pain:  Vitals:   12/27/23 0853  TempSrc: Temporal  PainSc: 8       Patients Stated Pain Goal: 5 (12/27/23 0853)  Complications: No notable events documented.

## 2023-12-27 NOTE — Anesthesia Procedure Notes (Signed)
 Anesthesia Regional Block: Popliteal block   Pre-Anesthetic Checklist: , timeout performed,  Correct Patient, Correct Site, Correct Laterality,  Correct Procedure, Correct Position, site marked,  Risks and benefits discussed,  Pre-op evaluation,  At surgeon's request and post-op pain management  Laterality: Left  Prep: Maximum Sterile Barrier Precautions used, chloraprep       Needles:  Injection technique: Single-shot  Needle Type: Echogenic Stimulator Needle     Needle Length: 9cm  Needle Gauge: 21     Additional Needles:   Procedures:,,,, ultrasound used (permanent image in chart),,    Narrative:  Start time: 12/27/2023 8:55 AM End time: 12/27/2023 9:05 AM Injection made incrementally with aspirations every 5 mL.  Performed by: Personally  Anesthesiologist: Epifanio Fallow, MD

## 2023-12-27 NOTE — Progress Notes (Signed)
Assisted Dr. Edmond Fitzgerald with left, adductor canal, popliteal, ultrasound guided block. Side rails up, monitors on throughout procedure. See vital signs in flow sheet. Tolerated Procedure well. 

## 2023-12-27 NOTE — Anesthesia Procedure Notes (Signed)
 Anesthesia Regional Block: Adductor canal block   Pre-Anesthetic Checklist: , timeout performed,  Correct Patient, Correct Site, Correct Laterality,  Correct Procedure, Correct Position, site marked,  Risks and benefits discussed,  Pre-op evaluation,  At surgeon's request and post-op pain management  Laterality: Left  Prep: Maximum Sterile Barrier Precautions used, chloraprep       Needles:  Injection technique: Single-shot  Needle Type: Echogenic Stimulator Needle     Needle Length: 9cm  Needle Gauge: 21     Additional Needles:   Procedures:,,,, ultrasound used (permanent image in chart),,    Narrative:  Start time: 12/27/2023 9:05 AM End time: 12/27/2023 9:08 AM Injection made incrementally with aspirations every 5 mL.  Performed by: Personally  Anesthesiologist: Epifanio Fallow, MD

## 2023-12-27 NOTE — Anesthesia Preprocedure Evaluation (Addendum)
 Anesthesia Evaluation  Patient identified by MRN, date of birth, ID band Patient awake    Reviewed: Allergy & Precautions, H&P , NPO status , Patient's Chart, lab work & pertinent test results  Airway Mallampati: I  TM Distance: >3 FB Neck ROM: Full    Dental no notable dental hx. (+) Teeth Intact, Dental Advisory Given   Pulmonary sleep apnea , Current Smoker   Pulmonary exam normal breath sounds clear to auscultation       Cardiovascular hypertension, Pt. on medications  Rhythm:Regular Rate:Normal     Neuro/Psych   Anxiety     negative neurological ROS     GI/Hepatic Neg liver ROS,GERD  Medicated,,  Endo/Other  negative endocrine ROS    Renal/GU negative Renal ROS  negative genitourinary   Musculoskeletal  (+) Arthritis ,    Abdominal   Peds  Hematology  (+) Blood dyscrasia, anemia   Anesthesia Other Findings   Reproductive/Obstetrics negative OB ROS                              Anesthesia Physical Anesthesia Plan  ASA: 3  Anesthesia Plan: General   Post-op Pain Management: Regional block* and Tylenol PO (pre-op)*   Induction: Intravenous  PONV Risk Score and Plan: 2 and Ondansetron , Dexamethasone and Midazolam  Airway Management Planned: LMA  Additional Equipment:   Intra-op Plan:   Post-operative Plan: Extubation in OR  Informed Consent: I have reviewed the patients History and Physical, chart, labs and discussed the procedure including the risks, benefits and alternatives for the proposed anesthesia with the patient or authorized representative who has indicated his/her understanding and acceptance.     Dental advisory given  Plan Discussed with: CRNA  Anesthesia Plan Comments:          Anesthesia Quick Evaluation

## 2023-12-27 NOTE — Anesthesia Postprocedure Evaluation (Signed)
 Anesthesia Post Note  Patient: Sean Haynes  Procedure(s) Performed: FUSION, JOINT, FOOT (Left: Foot)     Patient location during evaluation: PACU Anesthesia Type: General and Regional Level of consciousness: awake and alert Pain management: pain level controlled Vital Signs Assessment: post-procedure vital signs reviewed and stable Respiratory status: spontaneous breathing, nonlabored ventilation and respiratory function stable Cardiovascular status: blood pressure returned to baseline and stable Postop Assessment: no apparent nausea or vomiting Anesthetic complications: no   No notable events documented.  Last Vitals:  Vitals:   12/27/23 1245 12/27/23 1253  BP: (!) 147/82   Pulse: 67 66  Resp: 11 15  Temp:    SpO2: 97% 94%    Last Pain:  Vitals:   12/27/23 1253  TempSrc:   PainSc: 0-No pain    LLE Motor Response: No movement due to regional block (12/27/23 1253) LLE Sensation: No sensation (absent) (12/27/23 1253)          Caila Cirelli,W. EDMOND

## 2023-12-27 NOTE — Anesthesia Procedure Notes (Signed)
 Procedure Name: LMA Insertion Date/Time: 12/27/2023 10:43 AM  Performed by: Donnell Berwyn SQUIBB, CRNAPre-anesthesia Checklist: Patient identified, Emergency Drugs available, Suction available, Patient being monitored and Timeout performed Patient Re-evaluated:Patient Re-evaluated prior to induction Oxygen Delivery Method: Circle system utilized Preoxygenation: Pre-oxygenation with 100% oxygen Induction Type: IV induction Ventilation: Mask ventilation without difficulty and Oral airway inserted - appropriate to patient size LMA Size: 5.0 Number of attempts: 1 Placement Confirmation: positive ETCO2 and breath sounds checked- equal and bilateral Tube secured with: Tape Dental Injury: Teeth and Oropharynx as per pre-operative assessment

## 2024-01-02 ENCOUNTER — Encounter (HOSPITAL_BASED_OUTPATIENT_CLINIC_OR_DEPARTMENT_OTHER): Payer: Self-pay | Admitting: Orthopaedic Surgery
